# Patient Record
Sex: Female | Born: 2000 | Race: White | Hispanic: No | Marital: Single | Smoking: Never smoker
Health system: Southern US, Community
[De-identification: ages and names within clinical notes are randomized; demographics above are authoritative.]

## PROBLEM LIST (undated history)

## (undated) DIAGNOSIS — E119 Type 2 diabetes mellitus without complications: Secondary | ICD-10-CM

## (undated) DIAGNOSIS — F32A Depression, unspecified: Secondary | ICD-10-CM

## (undated) DIAGNOSIS — F419 Anxiety disorder, unspecified: Secondary | ICD-10-CM

## (undated) DIAGNOSIS — J45909 Unspecified asthma, uncomplicated: Secondary | ICD-10-CM

## (undated) HISTORY — DX: Unspecified asthma, uncomplicated: J45.909

## (undated) HISTORY — DX: Anxiety disorder, unspecified: F41.9

## (undated) HISTORY — PX: NO PAST SURGERIES: SHX2092

## (undated) HISTORY — DX: Depression, unspecified: F32.A

---
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
## 2021-01-14 ENCOUNTER — Encounter (INDEPENDENT_AMBULATORY_CARE_PROVIDER_SITE_OTHER): Payer: Self-pay | Admitting: Family

## 2021-01-14 ENCOUNTER — Other Ambulatory Visit: Payer: Self-pay

## 2021-01-14 ENCOUNTER — Ambulatory Visit (INDEPENDENT_AMBULATORY_CARE_PROVIDER_SITE_OTHER): Payer: BC Managed Care – PPO | Admitting: Family

## 2021-01-14 VITALS — BP 116/70 | HR 68 | Ht 67.52 in | Wt 172.8 lb

## 2021-01-14 DIAGNOSIS — R739 Hyperglycemia, unspecified: Secondary | ICD-10-CM

## 2021-01-14 DIAGNOSIS — E1065 Type 1 diabetes mellitus with hyperglycemia: Secondary | ICD-10-CM | POA: Diagnosis not present

## 2021-01-14 DIAGNOSIS — E10649 Type 1 diabetes mellitus with hypoglycemia without coma: Secondary | ICD-10-CM

## 2021-01-14 DIAGNOSIS — R7309 Other abnormal glucose: Secondary | ICD-10-CM | POA: Diagnosis not present

## 2021-01-14 DIAGNOSIS — Z794 Long term (current) use of insulin: Secondary | ICD-10-CM | POA: Diagnosis not present

## 2021-01-14 DIAGNOSIS — E109 Type 1 diabetes mellitus without complications: Secondary | ICD-10-CM

## 2021-01-14 LAB — POCT GLUCOSE (DEVICE FOR HOME USE): Glucose Fasting, POC: 200 mg/dL — AB (ref 70–99)

## 2021-01-14 LAB — POCT GLYCOSYLATED HEMOGLOBIN (HGB A1C): Hemoglobin A1C: 8.5 % — AB (ref 4.0–5.6)

## 2021-01-14 MED ORDER — INSULIN LISPRO (1 UNIT DIAL) 100 UNIT/ML (KWIKPEN): PEN_INJECTOR | SUBCUTANEOUS | 2 refills | Status: DC

## 2021-01-14 NOTE — Patient Instructions (Signed)

## 2021-01-14 NOTE — Progress Notes (Signed)
Pediatric Endocrinology Diabetes Consultation Follow-up Visit  Aelyn M Kaupp 07/05/2001 [id number]  Chief Complaint: Follow-up type 1 diabetes   Kaplan, Kristen W., PA-C   HPI: Sweet  is a 20 y.o. female presenting for follow-up of type 1 diabetes. she is accompanied to this visit by her mother.  1. Angell was diagnosed with type 1 diabetes on May 22, 2016. She had been complaining of polyuria/polydipsia/polyphagia with weight loss of about 16 pounds. She was seen by her PCP who determined that her blood sugar was 462 on POC and 513 on labs. They sent the family to the ER at [facility] where she was found to have type 1 diabetes. She was transitioned to [facility] for admission. She was admitted to the pediatric ward for introduction of insulin and diabetes education. She was started on MDI with Lantus and Novolog. She was transitioned to Humalog based on insurance coverage. She had a hemoglobin a1c of 12.6%. 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  2. Since last visit to [facility] on 08/2020, she has been well.  No ER visits or hospitalizations.  She is going to graduate from high school in June. She is working for a daycare a few days per week. She stays active with PE at school.   Wearing Dexcom CGM. She reports that she has started trying harder to carb count and dose her novolog correctly. Giving most injections before eating.   Concerns:  - Feels like she runs high between lunch and dinner.  - Frequently turns dexcom off because she doesn't like the alarms.    Insulin regimen: 38 units of Tresiba Humalog 100/20/4 plan  Hypoglycemia: Able to feel low blood sugars.  No glucagon needed recently.  Feels sweaty and shaky.  Blood glucose download:    Med-alert IaD: Not currently wearing. Injection sites: Arms, abdomen and legs  Annual labs due: 10/2020 ordered  Ophthalmology due: 2019. Discussed importance today.     3. ROS: Greater than 10 systems  reviewed with pertinent positives listed in HPI, otherwise neg. Constitutional: Sleeping well. Weight stable.  Eyes: No changes in vision. No blurry vision.  Ears/Nose/Mouth/Throat: No difficulty swallowing. No neck swelling  Cardiovascular: Denies palpitation. No chest pain  Respiratory: No increased work of breathing. No SOB Gastrointestinal: No constipation . Denies abdominal pain.  Genitourinary: No nocturia, no polyuria Neurologic: Normal sensation, no tremor Endocrine: No polydipsia.  No hyperpigmentation Psychiatric: Normal affect. Denies anxiety and depression.   Past Medical History:   No past medical history on file.  Medications:  Outpatient Encounter Medications as of 01/14/2021  Medication Sig  . Continuous Blood Gluc Sensor (DEXCOM G6 SENSOR) MISC Change sensor every 10 days  . Continuous Blood Gluc Transmit (DEXCOM G6 TRANSMITTER) MISC Inject 1 Device into the skin as directed. (re-use up to 8x with each new sensor)  . insulin degludec (TRESIBA FLEXTOUCH) 100 UNIT/ML FlexTouch Pen Inject up to 50 units per day SubQ  . [DISCONTINUED] insulin aspart (NOVOLOG) 100 UNIT/ML injection Inject into the skin 3 (three) times daily before meals.  . [DISCONTINUED] insulin lispro (HUMALOG) 100 UNIT/ML KwikPen INJECT UP TO 50 UNITS SUBCUTANEOUSLY DAILY  . albuterol (PROVENTIL HFA;VENTOLIN HFA) 108 (90 Base) MCG/ACT inhaler Inhale 2 puffs into the lungs every 6 (six) hours as needed for wheezing or shortness of breath. (Patient not taking: Reported on 04/28/2020)  . Blood Glucose Monitoring Suppl (ONETOUCH VERIO IQ SYSTEM) w/Device KIT Use to check blood glucose (Patient not taking: Reported on 09/14/2020)  .   Continuous Blood Gluc Receiver (DEXCOM G6 RECEIVER) DEVI 1 Device by Does not apply route as directed. (Patient not taking: Reported on 06/12/2020)  . Glucagon (BAQSIMI TWO PACK) 3 MG/DOSE POWD Place 3 mg into the nose as needed. (Patient not taking: Reported on 04/28/2020)  . insulin  lispro (HUMALOG) 100 UNIT/ML KwikPen Inject up to 50 units per day  . Insulin Pen Needle 31G X 4 MM MISC BD Pen Needles- brand specific Inject insulin via insulin pen 6 x daily  . ONETOUCH VERIO test strip CHECK BLOOD SUGAR 6X DAY (90 DAY SUPPLY) (Patient not taking: Reported on 09/14/2020)   No facility-administered encounter medications on file as of 01/14/2021.    Allergies: No Known Allergies  Surgical History: No past surgical history on file.  Family History:  No family history on file.    Social History: Lives with: Mother and Father  Currently in 11th grade  Physical Exam:  Vitals:   01/14/21 1119  BP: 116/70  Pulse: 68  Weight: 172 lb 12.8 oz (78.4 kg)  Height: 5' 7.52" (1.715 m)   BP 116/70 (BP Location: Right Arm, Patient Position: Sitting, Cuff Size: Normal)   Pulse 68   Ht 5' 7.52" (1.715 m)   Wt 172 lb 12.8 oz (78.4 kg)   BMI 26.65 kg/m  Body mass index: body mass index is 26.65 kg/m. Blood pressure percentiles are not available for patients who are 18 years or older.  Ht Readings from Last 3 Encounters:  01/14/21 5' 7.52" (1.715 m) (90 %, Z= 1.29)*  09/14/20 5' 7.6" (1.717 m) (91 %, Z= 1.32)*  06/12/20 5' 7.24" (1.708 m) (88 %, Z= 1.19)*   * Growth percentiles are based on CDC (Girls, 2-20 Years) data.   Wt Readings from Last 3 Encounters:  01/14/21 172 lb 12.8 oz (78.4 kg) (94 %, Z= 1.52)*  09/14/20 167 lb 6.4 oz (75.9 kg) (92 %, Z= 1.43)*  06/12/20 166 lb (75.3 kg) (92 %, Z= 1.42)*   * Growth percentiles are based on CDC (Girls, 2-20 Years) data.    PHYSICAL EXAM:  General: Well developed, well nourished female in no acute distress.   Head: Normocephalic, atraumatic.   Eyes:  Pupils equal and round. EOMI.   Sclera white.  No eye drainage.   Ears/Nose/Mouth/Throat: Nares patent, no nasal drainage.  Normal dentition, mucous membranes moist.   Neck: supple, no cervical lymphadenopathy, no thyromegaly Cardiovascular: regular rate, normal  S1/S2, no murmurs Respiratory: No increased work of breathing.  Lungs clear to auscultation bilaterally.  No wheezes. Abdomen: soft, nontender, nondistended. Normal bowel sounds.  No appreciable masses  Extremities: warm, well perfused, cap refill < 2 sec.   Musculoskeletal: Normal muscle mass.  Normal strength Skin: warm, dry.  No rash or lesions. Neurologic: alert and oriented, normal speech, no tremor   Labs: Last hemoglobin A1c: 11.9 % on 08/2020 Lab Results  Component Value Date   HGBA1C 8.5 (A) 01/14/2021   Results for orders placed or performed in visit on 01/14/21  POCT glycosylated hemoglobin (Hb A1C)  Result Value Ref Range   Hemoglobin A1C 8.5 (A) 4.0 - 5.6 %   HbA1c POC (<> result, manual entry)     HbA1c, POC (prediabetic range)     HbA1c, POC (controlled diabetic range)    POCT Glucose (Device for Home Use)  Result Value Ref Range   Glucose Fasting, POC 200 (A) 70 - 99 mg/dL   POC Glucose      Assessment/Plan: Jenaveve is   a 20 y.o. female with uncontrolled type 1 diabetes on MDI. She has made improvements since last visit and time in range has increased to >50% of the time. Needs to consistently wear Dexcom CGM. Hemoglobin A1c has improved to 8.5% but is higher then ADA gaol of <7.5%.    1-4. DM w/o complication type I, uncontrolled (HCC)/Hyperglycemia/Elevated A1c/hypoglycemia  - Tresiba 38 units  - Start Humalog 100/20/4 plan  - Reviewed meter and CGM download. Discussed trends and patterns.  - Rotate injection sites to prevent scar tissue.  - bolus 15 minutes prior to eating to limit blood sugar spikes.  - Reviewed carb counting and importance of accurate carb counting.  - Discussed signs and symptoms of hypoglycemia. Always have glucose available.  - POCT glucose and hemoglobin A1c  - Reviewed growth chart.  - Advised to put high alarms on mute and keep CGM with her at all times. Do not close Dexcom app.   Follow-up:   3 month.    >45 spent today  reviewing the medical chart, counseling the patient/family, and documenting today's visit.    When a patient is on insulin, intensive monitoring of blood glucose levels is necessary to avoid hyperglycemia and hypoglycemia. Severe hyperglycemia/hypoglycemia can lead to hospital admissions and be life threatening.     Hermenia Bers,  FNP-C  Pediatric Specialist  [address] [place]  [place], [address]  Tele: [phone]

## 2021-01-21 ENCOUNTER — Encounter (INDEPENDENT_AMBULATORY_CARE_PROVIDER_SITE_OTHER): Payer: Self-pay

## 2021-01-21 DIAGNOSIS — L700 Acne vulgaris: Secondary | ICD-10-CM | POA: Diagnosis not present

## 2021-01-21 DIAGNOSIS — D2339 Other benign neoplasm of skin of other parts of face: Secondary | ICD-10-CM | POA: Diagnosis not present

## 2021-01-28 ENCOUNTER — Encounter (INDEPENDENT_AMBULATORY_CARE_PROVIDER_SITE_OTHER): Payer: Self-pay

## 2021-01-28 ENCOUNTER — Other Ambulatory Visit (INDEPENDENT_AMBULATORY_CARE_PROVIDER_SITE_OTHER): Payer: Self-pay

## 2021-01-28 ENCOUNTER — Other Ambulatory Visit (INDEPENDENT_AMBULATORY_CARE_PROVIDER_SITE_OTHER): Payer: Self-pay | Admitting: Family

## 2021-01-28 MED ORDER — TRESIBA FLEXTOUCH 100 UNIT/ML ~~LOC~~ SOPN: PEN_INJECTOR | SUBCUTANEOUS | 5 refills | Status: DC

## 2021-02-01 ENCOUNTER — Encounter (INDEPENDENT_AMBULATORY_CARE_PROVIDER_SITE_OTHER): Payer: Self-pay

## 2021-02-17 ENCOUNTER — Other Ambulatory Visit (INDEPENDENT_AMBULATORY_CARE_PROVIDER_SITE_OTHER): Payer: Self-pay

## 2021-02-17 ENCOUNTER — Encounter (INDEPENDENT_AMBULATORY_CARE_PROVIDER_SITE_OTHER): Payer: Self-pay

## 2021-02-17 DIAGNOSIS — E109 Type 1 diabetes mellitus without complications: Secondary | ICD-10-CM

## 2021-02-17 MED ORDER — DEXCOM G6 SENSOR MISC: 1 refills | Status: DC

## 2021-03-10 ENCOUNTER — Other Ambulatory Visit (INDEPENDENT_AMBULATORY_CARE_PROVIDER_SITE_OTHER): Payer: Self-pay

## 2021-03-10 ENCOUNTER — Encounter (INDEPENDENT_AMBULATORY_CARE_PROVIDER_SITE_OTHER): Payer: Self-pay

## 2021-03-10 DIAGNOSIS — E109 Type 1 diabetes mellitus without complications: Secondary | ICD-10-CM

## 2021-03-10 MED ORDER — DEXCOM G6 TRANSMITTER MISC: 1.0000 | 3 refills | Status: DC

## 2021-03-10 MED ORDER — DEXCOM G6 SENSOR MISC: 3 refills | Status: DC

## 2021-04-01 ENCOUNTER — Encounter (INDEPENDENT_AMBULATORY_CARE_PROVIDER_SITE_OTHER): Payer: Self-pay

## 2021-04-06 ENCOUNTER — Encounter (INDEPENDENT_AMBULATORY_CARE_PROVIDER_SITE_OTHER): Payer: Self-pay

## 2021-04-07 ENCOUNTER — Other Ambulatory Visit (INDEPENDENT_AMBULATORY_CARE_PROVIDER_SITE_OTHER): Payer: Self-pay

## 2021-04-20 ENCOUNTER — Ambulatory Visit (INDEPENDENT_AMBULATORY_CARE_PROVIDER_SITE_OTHER): Payer: BC Managed Care – PPO | Admitting: Family

## 2021-05-10 DIAGNOSIS — B078 Other viral warts: Secondary | ICD-10-CM | POA: Diagnosis not present

## 2021-05-10 DIAGNOSIS — D2339 Other benign neoplasm of skin of other parts of face: Secondary | ICD-10-CM | POA: Diagnosis not present

## 2021-06-02 ENCOUNTER — Other Ambulatory Visit: Payer: Self-pay

## 2021-06-02 ENCOUNTER — Other Ambulatory Visit (INDEPENDENT_AMBULATORY_CARE_PROVIDER_SITE_OTHER): Payer: Self-pay

## 2021-06-02 ENCOUNTER — Encounter (INDEPENDENT_AMBULATORY_CARE_PROVIDER_SITE_OTHER): Payer: Self-pay | Admitting: Family

## 2021-06-02 ENCOUNTER — Ambulatory Visit (INDEPENDENT_AMBULATORY_CARE_PROVIDER_SITE_OTHER): Payer: BC Managed Care – PPO | Admitting: Family

## 2021-06-02 VITALS — BP 102/64 | HR 76 | Wt 174.8 lb

## 2021-06-02 DIAGNOSIS — Z794 Long term (current) use of insulin: Secondary | ICD-10-CM | POA: Diagnosis not present

## 2021-06-02 DIAGNOSIS — E109 Type 1 diabetes mellitus without complications: Secondary | ICD-10-CM

## 2021-06-02 LAB — POCT GLYCOSYLATED HEMOGLOBIN (HGB A1C): Hemoglobin A1C: 7.7 % — AB (ref 4.0–5.6)

## 2021-06-02 LAB — POCT GLUCOSE (DEVICE FOR HOME USE): Glucose Fasting, POC: 141 mg/dL — AB (ref 70–99)

## 2021-06-02 MED ORDER — DEXCOM G6 TRANSMITTER MISC: 1.0000 | 3 refills | Status: DC

## 2021-06-02 MED ORDER — TRESIBA FLEXTOUCH 100 UNIT/ML ~~LOC~~ SOPN: PEN_INJECTOR | SUBCUTANEOUS | 11 refills | Status: DC

## 2021-06-02 MED ORDER — BAQSIMI TWO PACK 3 MG/DOSE NA POWD: 3.0000 mg | NASAL | 3 refills | Status: AC | PRN

## 2021-06-02 MED ORDER — DEXCOM G6 SENSOR MISC: 3 refills | Status: DC

## 2021-06-02 NOTE — Patient Instructions (Addendum)
-   Give 1 unit for every 3 grams of carbs  - 1 unit for for every 20 points above 100 It was a pleasure seeing you in clinic today. Please do not hesitate to contact me if you have questions or concerns.   At [facility], we are committed to providing exceptional care. You will receive a patient satisfaction survey through text or email regarding your visit today. Your opinion is important to me. Comments are appreciated.

## 2021-06-02 NOTE — Progress Notes (Signed)
Pediatric Endocrinology Diabetes Consultation Follow-up Visit  [name] May 24, 2001 [id number]  Chief Complaint: Follow-up type 1 diabetes   Aletha Halim., PA-C   HPI: Leslie Mccarthy  is a 20 y.o. female presenting for follow-up of type 1 diabetes. she is accompanied to this visit by her mother.  1. Leslie Mccarthy was diagnosed with type 1 diabetes on May 22, 2016. She had been complaining of polyuria/polydipsia/polyphagia with weight loss of about 16 pounds. She was seen by her PCP who determined that her blood sugar was 462 on POC and 513 on labs. They sent the family to the ER at [facility] where she was found to have type 1 diabetes. She was transitioned to [facility] for admission. She was admitted to the pediatric ward for introduction of insulin and diabetes education. She was started on MDI with Lantus and Novolog. She was transitioned to Humalog based on insurance coverage. She had a hemoglobin a1c of 12.6%. 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  2. Since last visit to [facility] on 12/2020 she has been well.  No ER visits or hospitalizations.  She graduated from high school and was top of her class. She plans to do a [employer] year and then possible go back to school. 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   She wear Dexcom CGm majority of the time but recently had issues with bleeding at sensor site. When she is not wearing Dexcom she checks blood sugars 3-4 x per day. Gives Novolog shots before eating most of the time. Estimates she takes around 15 units per meal. Hypoglycemia is rare but usually occurs at night if she does go low.   Concerns:  - She has been stressed lately and blood sugars have been running hig.  - Since switching back to Lantus due to insurance blood sugars have been higher. Also reports that Lantus burns severely with injection and leave redness.   Insulin regimen: 38 units of Lantus,  Humalog 100/20/4 plan  Hypoglycemia: Able to  feel low blood sugars.  No glucagon needed recently.  Feels sweaty and shaky.  Blood glucose download:    Med-alert IaD: Not currently wearing. Injection sites: Arms, abdomen and legs  Annual labs due: 10/2020 ordered  Ophthalmology due: 2019. Discussed importance today.     3. ROS: Greater than 10 systems reviewed with pertinent positives listed in HPI, otherwise neg. Constitutional: Sleeping well. Weight stable.  Eyes: No changes in vision. No blurry vision.  Ears/Nose/Mouth/Throat: No difficulty swallowing. No neck swelling  Cardiovascular: Denies palpitation. No chest pain  Respiratory: No increased work of breathing. No SOB Gastrointestinal: No constipation . Denies abdominal pain.  Genitourinary: No nocturia, no polyuria Neurologic: Normal sensation, no tremor Endocrine: No polydipsia.  No hyperpigmentation Psychiatric: Normal affect. Denies anxiety and depression.   Past Medical History:   No past medical history on file.  Medications:  Outpatient Encounter Medications as of 06/02/2021  Medication Sig   albuterol (PROVENTIL HFA;VENTOLIN HFA) 108 (90 Base) MCG/ACT inhaler Inhale 2 puffs into the lungs every 6 (six) hours as needed for wheezing or shortness of breath.   Blood Glucose Monitoring Suppl (ONETOUCH VERIO IQ SYSTEM) w/Device KIT Use to check blood glucose   Continuous Blood Gluc Receiver (DEXCOM G6 RECEIVER) DEVI 1 Device by Does not apply route as directed.   insulin degludec (TRESIBA FLEXTOUCH) 100 UNIT/ML FlexTouch Pen Inject up to 50 units per day SubQ   insulin lispro (HUMALOG) 100 UNIT/ML KwikPen Inject  up to 50 units per day   Insulin Pen Needle 31G X 4 MM MISC BD Pen Needles- brand specific Inject insulin via insulin pen 6 x daily   ONETOUCH VERIO test strip CHECK BLOOD SUGAR 6X DAY (90 DAY SUPPLY)   [DISCONTINUED] Continuous Blood Gluc Sensor (DEXCOM G6 SENSOR) MISC Change sensor every 10 days   [DISCONTINUED] Continuous Blood Gluc Transmit (DEXCOM G6  TRANSMITTER) MISC Inject 1 Device into the skin as directed. Change sensor every 90 days   [DISCONTINUED] Glucagon (BAQSIMI TWO PACK) 3 MG/DOSE POWD Place 3 mg into the nose as needed.   [DISCONTINUED] insulin glargine (LANTUS SOLOSTAR) 100 UNIT/ML Solostar Pen Inject up to 50 units daily   No facility-administered encounter medications on file as of 06/02/2021.    Allergies: No Known Allergies  Surgical History: No past surgical history on file.  Family History:  No family history on file.    Social History: Lives with: Mother and Father  Graduated. Working as Consulting civil engineer.   Physical Exam:  Vitals:   06/02/21 1002  BP: 102/64  Pulse: 76  Weight: 174 lb 12.8 oz (79.3 kg)    BP 102/64 (BP Location: Right Arm, Patient Position: Sitting, Cuff Size: Normal)   Pulse 76   Wt 174 lb 12.8 oz (79.3 kg)   BMI 26.96 kg/m  Body mass index: body mass index is 26.96 kg/m. Blood pressure percentiles are not available for patients who are 18 years or older.  Ht Readings from Last 3 Encounters:  01/14/21 5' 7.52" (1.715 m) (90 %, Z= 1.29)*  09/14/20 5' 7.6" (1.717 m) (91 %, Z= 1.32)*  06/12/20 5' 7.24" (1.708 m) (88 %, Z= 1.19)*   * Growth percentiles are based on CDC (Girls, 2-20 Years) data.   Wt Readings from Last 3 Encounters:  06/02/21 174 lb 12.8 oz (79.3 kg) (94 %, Z= 1.54)*  01/14/21 172 lb 12.8 oz (78.4 kg) (94 %, Z= 1.52)*  09/14/20 167 lb 6.4 oz (75.9 kg) (92 %, Z= 1.43)*   * Growth percentiles are based on CDC (Girls, 2-20 Years) data.    PHYSICAL EXAM:   General: Well developed, well nourished female in no acute distress.   Head: Normocephalic, atraumatic.   Eyes:  Pupils equal and round. EOMI.   Sclera white.  No eye drainage.   Ears/Nose/Mouth/Throat: Nares patent, no nasal drainage.  Normal dentition, mucous membranes moist.   Neck: supple, no cervical lymphadenopathy, no thyromegaly Cardiovascular: regular rate, normal S1/S2, no murmurs Respiratory:  No increased work of breathing.  Lungs clear to auscultation bilaterally.  No wheezes. Abdomen: soft, nontender, nondistended. Normal bowel sounds.  No appreciable masses  Extremities: warm, well perfused, cap refill < 2 sec.   Musculoskeletal: Normal muscle mass.  Normal strength Skin: warm, dry.  No rash or lesions. Neurologic: alert and oriented, normal speech, no tremor  Labs: Last hemoglobin A1c: 8.5 % on 12/2020 Lab Results  Component Value Date   HGBA1C 7.7 (A) 06/02/2021   Results for orders placed or performed in visit on 06/02/21  POCT glycosylated hemoglobin (Hb A1C)  Result Value Ref Range   Hemoglobin A1C 7.7 (A) 4.0 - 5.6 %   HbA1c POC (<> result, manual entry)     HbA1c, POC (prediabetic range)     HbA1c, POC (controlled diabetic range)    POCT Glucose (Device for Home Use)  Result Value Ref Range   Glucose Fasting, POC 141 (A) 70 - 99 mg/dL   POC Glucose  Assessment/Plan: Catelyn is a 20 y.o. female with uncontrolled type 1 diabetes on MDI. She is having more variability with blood sugars since changing from [place] to Lantus 1 month ago. Also has significant burning. Pattern of hyperglycemia between 10am-3pm. Hemoglobin A1c is 7.7% today.     1-4. DM w/o complication type I, uncontrolled (HCC)/Hyperglycemia/Elevated A1c/hypoglycemia  - restart tresiba. Our office will do PA if necessary since it provides with better glucose stability/control and less burning. She will take 38 units daily  - Start Humalog 100/20/3 plan  - Reviewed  CGM download. Discussed trends and patterns.  - Rotate injection sites to prevent scar tissue.  - bolus 15 minutes prior to eating to limit blood sugar spikes.  - Reviewed carb counting and importance of accurate carb counting.  - Discussed signs and symptoms of hypoglycemia. Always have glucose available.  - POCT glucose and hemoglobin A1c  - Reviewed growth chart.  - Discussed closed loop insulin pumps including Omnipod 5 and  tandem tslim  - Lipid panel, TFTs and microalbumin ordered.   Follow-up:   3 month.   >45 spent today reviewing the medical chart, counseling the patient/family, and documenting today's visit.    When a patient is on insulin, intensive monitoring of blood glucose levels is necessary to avoid hyperglycemia and hypoglycemia. Severe hyperglycemia/hypoglycemia can lead to hospital admissions and be life threatening.     Hermenia Bers,  FNP-C  Pediatric Specialist  [address] [place]  [place], [address]  Tele: [phone]

## 2021-06-03 ENCOUNTER — Encounter (INDEPENDENT_AMBULATORY_CARE_PROVIDER_SITE_OTHER): Payer: Self-pay

## 2021-06-03 LAB — LIPID PANEL
Cholesterol: 137 mg/dL (ref <170)
HDL: 50 mg/dL (ref >45)
LDL Cholesterol (Calc): 71 mg/dL (calc) (ref <110)
Non-HDL Cholesterol (Calc): 87 mg/dL (calc) (ref <120)
Total CHOL/HDL Ratio: 2.7 (calc) (ref <5.0)
Triglycerides: 77 mg/dL (ref <90)

## 2021-06-03 LAB — MICROALBUMIN / CREATININE URINE RATIO
Creatinine, Urine: 244 mg/dL (ref 20–275)
Microalb Creat Ratio: 2 mcg/mg creat (ref <30)
Microalb, Ur: 0.4 mg/dL

## 2021-06-03 LAB — TSH: TSH: 1.12 mIU/L

## 2021-06-03 LAB — T4, FREE: Free T4: 1.1 ng/dL (ref 0.8–1.4)

## 2021-06-26 ENCOUNTER — Encounter (INDEPENDENT_AMBULATORY_CARE_PROVIDER_SITE_OTHER): Payer: Self-pay

## 2021-06-29 ENCOUNTER — Other Ambulatory Visit (INDEPENDENT_AMBULATORY_CARE_PROVIDER_SITE_OTHER): Payer: Self-pay

## 2021-06-29 DIAGNOSIS — E109 Type 1 diabetes mellitus without complications: Secondary | ICD-10-CM

## 2021-06-29 MED ORDER — DEXCOM G6 TRANSMITTER MISC: 1.0000 | 3 refills | Status: DC

## 2021-07-28 ENCOUNTER — Encounter (INDEPENDENT_AMBULATORY_CARE_PROVIDER_SITE_OTHER): Payer: Self-pay

## 2021-07-30 ENCOUNTER — Telehealth (INDEPENDENT_AMBULATORY_CARE_PROVIDER_SITE_OTHER): Payer: Self-pay | Admitting: Pharmacist

## 2021-07-30 DIAGNOSIS — E109 Type 1 diabetes mellitus without complications: Secondary | ICD-10-CM

## 2021-07-30 NOTE — Telephone Encounter (Signed)
Please contact most recently utilized pharmacy to determine pharmacy benefits  [phone] [place], [place] - [address]  [address] [place], [place] [address]  Phone:  [phone]  Fax:  [phone]  DEA #:  [id number]  DAW Reason: --   Please ask pharmacy staff member for following information: 1) RxBIN 2) RxGroup 3) RxPCN 4) ID number  Please also ask for pharmacy help desk phone number.  Thank you for involving clinical pharmacist/diabetes educator to assist in providing this patient's care.   Zachery Conch, PharmD, BCACP, CDCES, CPP

## 2021-07-30 NOTE — Telephone Encounter (Signed)
1) RxBIN [id number] 2) RxGroup [id number] 3) RxPCN  WG 4) ID number [id number]  [employer]

## 2021-08-04 NOTE — Telephone Encounter (Signed)
Please run benefits investigation for Omnipod 5 device. This is not a specialty medication and can be filled at the local pharmacy.   Omnipod 5 G6 Intro Kit (1 kit, 30 day supply), NDC [address]-69   Omnipod 5 G6 Pods (3 boxes (each box has 5 pods), 30 day supply), NDC [phone]   Can you also please let me know 1) if PA is required? 2) RxBIN, RxPCN, RxGroup, ID number of pharmacy benefits    Thank you for involving clinical pharmacist/diabetes educator to assist in providing this patient's care.    Drexel Iha, PharmD, BCACP, [place], CPP

## 2021-08-15 ENCOUNTER — Other Ambulatory Visit (INDEPENDENT_AMBULATORY_CARE_PROVIDER_SITE_OTHER): Payer: Self-pay | Admitting: Family

## 2021-08-15 DIAGNOSIS — E109 Type 1 diabetes mellitus without complications: Secondary | ICD-10-CM

## 2021-08-16 MED ORDER — OMNIPOD 5 DEXG7G6 INTRO GEN 5 KIT: 1.0000 | PACK | 1 refills | Status: DC

## 2021-08-16 NOTE — Addendum Note (Signed)
Addended by: Buena Irish on: 08/16/2021 04:33 PM   Modules accepted: Orders

## 2021-08-16 NOTE — Telephone Encounter (Signed)
Called patient's most recent pharmacy to obtain insurance benefits information and received the following information:  Rx Bin: [id number] Rx PCN: [id number] Rx Group: [id number] ID Number: [id number]  Relationship: [facility] Pharm[facility] Help Desk: [phone]  Called patients's pharmacy benefits to run a pharmacy benefits check on the following medications/devices:  Omnipod 5 G6 Intro Kit (1 kit, 30 day supply), NDC [address]-01   Omnipod 5 G6 Pods (3 boxes (each box has 5 pods), 30 day supply), NDC [phone]  Omnipod 5 G6 Intro Kit and Omnipod 5 G6 Pods will require prior authorization. Prior Authorization was submitted and completed through Cover My Meds.   Estimated copay for the following medications is: Omnipod 5 G6 Intro Kit (1 kit, 30 day supply), NDC [address]-01: $0.00   Omnipod 5 G6 Pods (3 boxes (each box has 5 pods), 30 day supply), NDC [address]-21: $0.00     Thank you for involving pharmacy in this patient's care.  Pamela Vega Rios, PharmD PGY1 Ambulatory Care Pharmacy Resident 08/16/2021 3:48 PM  

## 2021-08-16 NOTE — Telephone Encounter (Signed)
Appreciate Dr. Wendee Beavers Rios's assistance as this was a tedious request.   Omnipod 5 Intro Kit will cost $0. The pod refills will also request $0. I sent prescription to following local pharmacy  [id number] [place], [place] - [address]  [address] [place], [place] [address]  Phone:  [phone]  Fax:  [phone]  DEA #:  [id number]  [place] Reason: --   Please inform patient. Patient does not have to get prescription from pharmacy before pump appointment.  Please schedule the following appointments:   Prepump appointment (60 min, in person or virtual training); must be scheduled before pump start appointment Pump start appointment (120 min, MUST BE IN PERSON,), schedule after pump appointment   Thank you for involving clinical pharmacist/diabetes educator to assist in providing this patient's care.    Drexel Iha, PharmD, BCACP, [place], CPP

## 2021-08-19 ENCOUNTER — Telehealth (INDEPENDENT_AMBULATORY_CARE_PROVIDER_SITE_OTHER): Payer: Self-pay

## 2021-08-19 NOTE — Telephone Encounter (Addendum)
Received approval for Omni Pod 5 Gen 6. Approval coverage 08/16/2021-08/16/2022.   Received approval for OmniPod 5 intro Kit Gen 5 Approval date 08/16/2021-09/15/2021

## 2021-09-09 ENCOUNTER — Other Ambulatory Visit: Payer: Self-pay

## 2021-09-09 ENCOUNTER — Ambulatory Visit (INDEPENDENT_AMBULATORY_CARE_PROVIDER_SITE_OTHER): Payer: BC Managed Care – PPO | Admitting: Pharmacist

## 2021-09-09 VITALS — Wt 161.8 lb

## 2021-09-09 DIAGNOSIS — E109 Type 1 diabetes mellitus without complications: Secondary | ICD-10-CM

## 2021-09-09 LAB — POCT GLUCOSE (DEVICE FOR HOME USE): Glucose Fasting, POC: 173 mg/dL — AB (ref 70–99)

## 2021-09-09 LAB — POCT GLYCOSYLATED HEMOGLOBIN (HGB A1C): Hemoglobin A1C: 8.7 % — AB (ref 4.0–5.6)

## 2021-09-09 MED ORDER — INSULIN LISPRO 100 UNIT/ML IJ SOLN: INTRAMUSCULAR | 5 refills | Status: DC

## 2021-09-09 NOTE — Progress Notes (Addendum)
S:     Chief Complaint  Patient presents with   Diabetes    Prepump Education    Endocrinology provider: Gretchen Short, NP (no upcoming appt)  Patient has decided to initiate process to start Omnipod 5 insulin pump. PMH significant for T1DM.   Patient presents today with her mother, Leslie Mccarthy. She is taking 10-20 units of Humalog three times daily. She forgot her Humalog once in the past few weeks. She has not forgotten to take [place].   Insurance Coverage: BCBS Rx [place]: [id number] Rx PCN: [facility] Rx Group: [id number] ID Number: [id number]  Relationship: [facility] Help Desk: [phone]  [phone] [place] [place] - [address]  [address] [place], [place] [address]  Phone:  [phone]  Fax:  [phone]  DEA #:  [id number]  DAW Reason: --   Medication Adherence -Patient reports mostly adherence with medications.  -Current diabetes medications include: Tresiba 38 units daily, Humalog (ICR 1:3, ISF 1:20, target BG 100) -Prior diabetes medications include: Lantus (switched to [place] due to stinging (pH of Lantus is ~4 while all other insulin is~7))   Pre-pump Topics Insulin Pump Basics Sick Day Management Pump Failure Travel  Pump Start Instructions   Labs:    There were no vitals filed for this visit.  HbA1c Lab Results  Component Value Date   HGBA1C 7.7 (A) 06/02/2021   HGBA1C 8.5 (A) 01/14/2021   HGBA1C 11.9 (A) 09/14/2020    Pancreatic Islet Cell Autoantibodies Lab Results  Component Value Date   ISLETAB 1:256 (H) 05/22/2016    Insulin Autoantibodies No results found for: INSULINAB  Glutamic Acid Decarboxylase Autoantibodies Lab Results  Component Value Date   GLUTAMICACAB 14.1 (H) 05/22/2016    ZnT8 Autoantibodies No results found for: ZNT8AB  IA-2 Autoantibodies No results found for: LABIA2  C-Peptide Lab Results  Component Value Date   CPEPTIDE 0.8 (L)  05/22/2016    Microalbumin Lab Results  Component Value Date   MICRALBCREAT 2 06/02/2021    Lipids    Component Value Date/Time   CHOL 137 06/02/2021 1038   TRIG 77 06/02/2021 1038   HDL 50 06/02/2021 1038   CHOLHDL 2.7 06/02/2021 1038   LDLCALC 71 06/02/2021 1038    Assessment: Education - Thoroughly discussed all pre-pump topics (insulin pump basics, sick day management, pump failure, travel, and pump start instructions).   Pump Start Instructions - Sent prescription for Humalog vial to patient's preferred pharmacy. The patient/family understand that the family should bring all insulin pump supplies as well as insulin vial to pump start appointment. Advised patient to follow Guinea-B[place]au taper: 2 days prior to pump start take [place] 19 units daily, 1 day prior to pump start: skip Guinea-Bissau, day of pump start: skip Guinea-Bissau.   Plan: Pre-Pump Education Discussed all pre-pump topics (insulin pump basics, sick day management, pump failure, travel, and pump start instructions) until family felt confident in their understanding of each topic.  Pump Start Appointment Sent prescription for Humalog vial to patient's preferred pharmacy.  The patient/family understand that the family should bring all insulin pump supplies as well as insulin vial to pump start appointment.  Advised patient to follow Guinea-Bissau taper: 2 days prior to pump start take [place] 19 units daily, 1 day prior to pump start: skip Guinea-Bissau, day of pump start: skip [place]-B[place]au.  Follow Up: 10/14/21 8:30 am  Written patient instructions provided; emailed copy to ([email or web address], [email or web address])  This appointment required 60 minutes of patient care (this includes precharting, chart review, review of results, face-to-face care, etc.).  Thank you for involving clinical pharmacist/diabetes educator to assist in providing this patient's care.  Zachery Conch, PharmD, BCACP, CDCES, CPP  I have reviewed the following  documentation and am in agreeance with the plan. I was immediately available to the clinical pharmacist for questions and collaboration.  Gretchen Short,  FNP-C  Pediatric Specialist  [address]  [place], [address]  Tele: [phone]

## 2021-09-15 ENCOUNTER — Encounter (INDEPENDENT_AMBULATORY_CARE_PROVIDER_SITE_OTHER): Payer: Self-pay | Admitting: Family

## 2021-09-15 ENCOUNTER — Ambulatory Visit (INDEPENDENT_AMBULATORY_CARE_PROVIDER_SITE_OTHER): Payer: BC Managed Care – PPO | Admitting: Family

## 2021-09-15 ENCOUNTER — Other Ambulatory Visit: Payer: Self-pay

## 2021-09-15 ENCOUNTER — Telehealth (INDEPENDENT_AMBULATORY_CARE_PROVIDER_SITE_OTHER): Payer: Self-pay | Admitting: Family

## 2021-09-15 VITALS — BP 116/70 | HR 62 | Wt 163.0 lb

## 2021-09-15 DIAGNOSIS — E109 Type 1 diabetes mellitus without complications: Secondary | ICD-10-CM

## 2021-09-15 DIAGNOSIS — Z794 Long term (current) use of insulin: Secondary | ICD-10-CM | POA: Diagnosis not present

## 2021-09-15 LAB — POCT GLUCOSE (DEVICE FOR HOME USE): POC Glucose: 66 mg/dl — AB (ref 70–99)

## 2021-09-15 LAB — POCT GLYCOSYLATED HEMOGLOBIN (HGB A1C): Hemoglobin A1C: 8.9 % — AB (ref 4.0–5.6)

## 2021-09-15 NOTE — Progress Notes (Signed)
Pediatric Endocrinology Diabetes Consultation Follow-up Visit  CONLEY PAWLING 04-13-2001 [id number]  Chief Complaint: Follow-up type 1 diabetes   Leslie Mccarthy., PA-C   HPI: Leslie Mccarthy  is a 20 y.o. female presenting for follow-up of type 1 diabetes. she is accompanied to this visit by her mother.  1. Leslie Mccarthy was diagnosed with type 1 diabetes on May 22, 2016. She had been complaining of polyuria/polydipsia/polyphagia with weight loss of about 16 pounds. She was seen by her PCP who determined that her blood sugar was 462 on POC and 513 on labs. They sent the family to the ER at [facility] where she was found to have type 1 diabetes. She was transitioned to [facility] for admission. She was admitted to the pediatric ward for introduction of insulin and diabetes education. She was started on MDI with Lantus and Novolog. She was transitioned to Humalog based on insurance coverage. She had a hemoglobin a1c of 12.6%. 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  2. Since last visit to [facility] on 04/2021 she has been well.  No ER visits or hospitalizations.  She is an assistance teach for kindergarten full time, she is enjoying. Considering going back to school but she is unsure at this point. 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   She recently met with Dr. Ladona Ridgel to discuss insulin pump therapy, she is going to start the Omnipod 5 soon. She reports she is doing well giving injections before eating. Using Dexcom CGM which is working well for her. Hypoglycemia has been rare in general.   Concerns:  - Typically runs high after lunch.    Insulin regimen: 32 units of Lantus,  Humalog 100/20/3 plan  Hypoglycemia: Able to feel low blood sugars.  No glucagon needed recently.  Feels sweaty and shaky.  Blood glucose download:    Med-alert IaD: Not currently wearing. Injection sites: Arms, abdomen and legs  Annual labs due: 04/2022 ordered  Ophthalmology due: 2019.  Discussed importance today.     3. ROS: Greater than 10 systems reviewed with pertinent positives listed in HPI, otherwise neg. Constitutional: Sleeping well. Weight stable.  Eyes: No changes in vision. No blurry vision.  Ears/Nose/Mouth/Throat: No difficulty swallowing. No neck swelling  Cardiovascular: Denies palpitation. No chest pain  Respiratory: No increased work of breathing. No SOB Gastrointestinal: No constipation . Denies abdominal pain.  Genitourinary: No nocturia, no polyuria Neurologic: Normal sensation, no tremor Endocrine: No polydipsia.  No hyperpigmentation Psychiatric: Normal affect. Denies anxiety and depression.   Past Medical History:   No past medical history on file.  Medications:  Outpatient Encounter Medications as of 09/15/2021  Medication Sig   albuterol (PROVENTIL HFA;VENTOLIN HFA) 108 (90 Base) MCG/ACT inhaler Inhale 2 puffs into the lungs every 6 (six) hours as needed for wheezing or shortness of breath. (Patient not taking: Reported on 09/09/2021)   Blood Glucose Monitoring Suppl (ONETOUCH VERIO IQ SYSTEM) w/Device KIT Use to check blood glucose (Patient not taking: Reported on 09/09/2021)   Continuous Blood Gluc Receiver (DEXCOM G6 RECEIVER) DEVI 1 Device by Does not apply route as directed. (Patient not taking: Reported on 09/09/2021)   Continuous Blood Gluc Sensor (DEXCOM G6 SENSOR) MISC Change sensor every 10 days   Continuous Blood Gluc Transmit (DEXCOM G6 TRANSMITTER) MISC Inject 1 Device into the skin as directed. Change sensor every 90 days   doxycycline (VIBRA-TABS) 100 MG tablet Take 100 mg by mouth 2 (two) times daily as needed.  Glucagon (BAQSIMI TWO PACK) 3 MG/DOSE POWD Place 3 mg into the nose as needed. (Patient not taking: Reported on 09/09/2021)   insulin degludec (TRESIBA FLEXTOUCH) 100 UNIT/ML FlexTouch Pen Inject up to 50 units per day SubQ   Insulin Disposable Pump (OMNIPOD 5 G6 INTRO, GEN 5,) KIT Inject 1 Device into the skin as  directed. Change pod every 2 days. This will be a 30 day supply. Please fill for [facility] [id number] (Patient not taking: Reported on 09/09/2021)   insulin lispro (HUMALOG) 100 UNIT/ML injection Inject up to 200 units into insulin pump every 2-3 days per provider guidance. Please fill for insulin VIAL.   insulin lispro (HUMALOG) 100 UNIT/ML KwikPen Inject up to 50 units per day   Insulin Pen Needle (B-D UF III MINI PEN NEEDLES) 31G X 5 MM MISC USE AS DIRECTED TO INJECT INSULIN 6 TIMES DAILY   ONETOUCH VERIO test strip CHECK BLOOD SUGAR 6X DAY (90 DAY SUPPLY) (Patient not taking: Reported on 09/09/2021)   No facility-administered encounter medications on file as of 09/15/2021.    Allergies: No Known Allergies  Surgical History: No past surgical history on file.  Family History:  No family history on file.    Social History: Lives with: Mother and Father  Graduated. Working as Consulting civil engineer.   Physical Exam:  There were no vitals filed for this visit.   There were no vitals taken for this visit. Body mass index: body mass index is unknown because there is no height or weight on file. Blood pressure percentiles are not available for patients who are 18 years or older.  Ht Readings from Last 3 Encounters:  01/14/21 5' 7.52" (1.715 m) (90 %, Z= 1.29)*  09/14/20 5' 7.6" (1.717 m) (91 %, Z= 1.32)*  06/12/20 5' 7.24" (1.708 m) (88 %, Z= 1.19)*   * Growth percentiles are based on CDC (Girls, 2-20 Years) data.   Wt Readings from Last 3 Encounters:  09/09/21 161 lb 12.8 oz (73.4 kg) (89 %, Z= 1.23)*  06/02/21 174 lb 12.8 oz (79.3 kg) (94 %, Z= 1.54)*  01/14/21 172 lb 12.8 oz (78.4 kg) (94 %, Z= 1.52)*   * Growth percentiles are based on CDC (Girls, 2-20 Years) data.    PHYSICAL EXAM:  General: Well developed, well nourished female in no acute distress.   Head: Normocephalic, atraumatic.   Eyes:  Pupils equal and round. EOMI.   Sclera white.  No eye drainage.    Ears/Nose/Mouth/Throat: Nares patent, no nasal drainage.  Normal dentition, mucous membranes moist.   Neck: supple, no cervical lymphadenopathy, no thyromegaly Cardiovascular: regular rate, normal S1/S2, no murmurs Respiratory: No increased work of breathing.  Lungs clear to auscultation bilaterally.  No wheezes. Abdomen: soft, nontender, nondistended. Normal bowel sounds.  No appreciable masses  Extremities: warm, well perfused, cap refill < 2 sec.   Musculoskeletal: Normal muscle mass.  Normal strength Skin: warm, dry.  No rash or lesions. Neurologic: alert and oriented, normal speech, no tremor   Labs: Last hemoglobin A1c: 8.7 % on 08/2021 Lab Results  Component Value Date   HGBA1C 8.7 (A) 09/09/2021   Results for orders placed or performed in visit on 09/09/21  POCT Glucose (Device for Home Use)  Result Value Ref Range   Glucose Fasting, POC 173 (A) 70 - 99 mg/dL   POC Glucose    POCT glycosylated hemoglobin (Hb A1C)  Result Value Ref Range   Hemoglobin A1C 8.7 (A) 4.0 - 5.6 %   HbA1c  POC (<> result, manual entry)     HbA1c, POC (prediabetic range)     HbA1c, POC (controlled diabetic range)      Assessment/Plan: Janith is a 20 y.o. female with uncontrolled type 1 diabetes on MDI. She is having more hyperglycemia throughout the day, needs more long acting insulin. She will benefit from closed loop Omnipod 5 insulin pump. Hemoglobin A1c is 8.7 on 08/2021.    1-4. DM w/o complication type I, uncontrolled (HCC)/Hyperglycemia/Elevated A1c/hypoglycemia  - Increase Tresiba to 35 units  -  Humalog 100/20/3 plan  - Reviewed meter and CGM download. Discussed trends and patterns.  - Rotate injection sites to prevent scar tissue.  - bolus 15 minutes prior to eating to limit blood sugar spikes.  - Reviewed carb counting and importance of accurate carb counting.  - Discussed signs and symptoms of hypoglycemia. Always have glucose available.  - POCT glucose and hemoglobin A1c  -  Reviewed growth chart.    Follow-up:   3 month.   >45 spent today reviewing the medical chart, counseling the patient/family, and documenting today's visit.    When a patient is on insulin, intensive monitoring of blood glucose levels is necessary to avoid hyperglycemia and hypoglycemia. Severe hyperglycemia/hypoglycemia can lead to hospital admissions and be life threatening.     Hermenia Bers,  FNP-C  Pediatric Specialist  [address] [place]  [place], [address]  Tele: [phone]

## 2021-09-15 NOTE — Patient Instructions (Signed)
It was a pleasure seeing you in clinic today. Please do not hesitate to contact me if you have questions or concerns.  ° °Please sign up for MyChart. This is a communication tool that allows you to send an email directly to me. This can be used for questions, prescriptions and blood sugar reports. We will also release labs to you with instructions on MyChart. Please do not use MyChart if you need immediate or emergency assistance. Ask our wonderful front office staff if you need assistance.  ° °

## 2021-09-15 NOTE — Telephone Encounter (Signed)
  Who's calling (name and relationship to patient) :[facility]   Best contact number: [phone] Provider they see: Ovidio Kin Reason for call:  Please contact pharmacy to discuss patient, a fax was also sent to office in regards to the matter. Office hours M-F 8am -8pm   PRESCRIPTION REFILL ONLY  Name of prescription:  Pharmacy:

## 2021-09-18 ENCOUNTER — Encounter (INDEPENDENT_AMBULATORY_CARE_PROVIDER_SITE_OTHER): Payer: Self-pay

## 2021-09-20 ENCOUNTER — Telehealth (INDEPENDENT_AMBULATORY_CARE_PROVIDER_SITE_OTHER): Payer: Self-pay | Admitting: Family

## 2021-09-20 NOTE — Telephone Encounter (Signed)
  Who's calling (name and relationship to patient) : Tour manager number:  Provider they see Ovidio Kin   Reason for call:fax was sent last week for script sent for omnipod 5 wanted to know if fax was received     PRESCRIPTION REFILL ONLY  Name of prescription:  Pharmacy:

## 2021-09-28 ENCOUNTER — Telehealth (INDEPENDENT_AMBULATORY_CARE_PROVIDER_SITE_OTHER): Payer: Self-pay | Admitting: Family

## 2021-09-28 NOTE — Telephone Encounter (Signed)
  Who's calling (name and relationship to patient) : Pharmacy   Best contact number:[phone]  Provider they ZOX:[id number]   Reason for call: Status of prescription request for Omnipod 5 sent on 11.28.2022    PRESCRIPTION REFILL ONLY  Name of prescription: Omnipod 5  Pharmacy:

## 2021-09-29 ENCOUNTER — Encounter (INDEPENDENT_AMBULATORY_CARE_PROVIDER_SITE_OTHER): Payer: Self-pay

## 2021-10-09 NOTE — Progress Notes (Addendum)
Subjective:  Chief Complaint  Patient presents with   Diabetes    Education    Endocrinology provider: Gretchen Short, NP (upcoming appt 12/13/21 11:00 am)  Patient referred to me by Gretchen Short, NP for Omnipod 5 pump training. PMH significant for T1DM, secondary amenorrhia. Patient is currently using Dexcom G6 CGM. Patient reports taking Tresiba 35 units daily and Humalog (1:3, ISF 1:20, target BG 100) plan. Patient reported following basal insulin taper provided at prior appt on 09/09/22.  Patient presents today with her mother French Ana). She is taking Humalog 12-16 units with her meals (2 meals/day) and 10 units with her snacks (2-3 snacks/day).    Insurance Coverage: BCBS Rx [place]: [id number] Rx PCN: [id number] Rx Group: [id number] ID Number: [id number]  Relationship: [facility] Help Desk: [phone]   [phone] [place] [place] - [address]  [address] [place], [place] [address]  Phone:  [phone]  Fax:  [phone]  DEA #:  [id number]  DAW Reason: --   Omnipod 5 Pump Serial Number: [id number]  Omnipod Education Training Please refer to Omnipod 5 Pod Start Checklist scanned into media  [facility] Account:  [email or web address]!  [facility]der Account:  Quita Skye [phone]!  Objective:  Dexcom Clarity Report    There were no vitals filed for this visit.  HbA1c Lab Results  Component Value Date   HGBA1C 8.9 (A) 09/15/2021   HGBA1C 8.7 (A) 09/09/2021   HGBA1C 7.7 (A) 06/02/2021    Pancreatic Islet Cell Autoantibodies Lab Results  Component Value Date   ISLETAB 1:256 (H) 05/22/2016    Insulin Autoantibodies No results found for: INSULINAB  Glutamic Acid Decarboxylase Autoantibodies Lab Results  Component Value Date   GLUTAMICACAB 14.1 (H) 05/22/2016    ZnT8 Autoantibodies No results found for: ZNT8AB  IA-2 Autoantibodies No results found  for: LABIA2  C-Peptide Lab Results  Component Value Date   CPEPTIDE 0.8 (L) 05/22/2016    Microalbumin Lab Results  Component Value Date   MICRALBCREAT 2 06/02/2021    Lipids    Component Value Date/Time   CHOL 137 06/02/2021 1038   TRIG 77 06/02/2021 1038   HDL 50 06/02/2021 1038   CHOLHDL 2.7 06/02/2021 1038   LDLCALC 71 06/02/2021 1038    Assessment: Pump Settings - Reviewed Dexcom Claity report. She is not at goal TIR >70%. Hypoglycemia tends to occur overnight (between 7-8AM), but not every night. Patient does not administer correction dose before bed. The latest she will eat a snack to bolus is at 9PM. She does not eat breakfast. She is taking ~83 units for her TDD, which is equivalent to ~1.13 units/kg/day. Will increase to 93 units for her TDD (1.26 units/kg/day). Based on rule of 450 ICR should be ~5 and rule of 1800 ISF should be ~20.Will continue current ICR 3 and current ISF. Will change target BG to 110 considering upgrade to hybrid closed loop system.  Medication Samples have been provided to the patient.  Drug name: Novolog 100 units/mL Qty: 1  LOT: [id number]  Exp.Date: 11/23/2002   Pump Education - Omnipod pump applied successfully to back of left arm (within line of sight from Dexcom (also on left arm)). Parents appeared to have sufficient understanding of subjects discussed during Omnipod Training appt.  Plan: Pump Settings  Basal (Max: 3.0 units/hr) 12AM 1.40  6AM 1.55  11PM 1.40  Total: 36.15 units  Insulin to carbohydrate ratio (ICR)  12AM 3  12PM 3  4:30PM 3               Max Bolus: 30 units  Insulin Sensitivity Factor (ISF) 12AM 20                       Target BG 12AM 110                        Omnipod Pump Education:  Continue to wear Omnipod and change pod every 2 days (pod filled 200 units) Thoroughly discussed how to assess bad infusion site change and appropriate management (notice BG is elevated,  attempt to bolus via pump, recheck BG in 30 minutes, if BG has not decreased then disconnect pump and administer bolus via insulin pen, apply new infusion set, and repeat process).  Discussed back up plan if pump breaks (how to calculate insulin doses using insulin pens). Provided written copy of patient's current pump settings and handout explaining math on how to calculate settings. Discussed examples with family. Patient was able to use teach back method to demonstrate understanding of calculating dose for basal/bolus insulin pens from insulin pump settings.  Patient has Lantus and Humalog insulin pen refills to use as back up until 12/2021. Reminded family they will need a new prescription annually.  Reimbursement Uploaded Omnipod 5 Pod Start Checklist and Omnipod Dash Pump Therapy Order Form to [place] Follow Up:  11/03/21  Emailed Omnipod 5 Resource guide to [email or web address] and [email or web address]  This appointment required 120 minutes of patient care (this includes precharting, chart review, review of results, face-to-face care, etc.).  Thank you for involving clinical pharmacist/diabetes educator to assist in providing this patient's care.  [employer], PharmD, BCACP, CDCES, CPP  I have reviewed the following documentation and am in agreeance with the plan. I was immediately available to the clinical pharmacist for questions and collaboration.  Zachery Conch,  FNP-C  Pediatric Specialist  [address]  [place], [place]  Tele: [phone]

## 2021-10-14 ENCOUNTER — Other Ambulatory Visit: Payer: Self-pay

## 2021-10-14 ENCOUNTER — Telehealth (INDEPENDENT_AMBULATORY_CARE_PROVIDER_SITE_OTHER): Payer: Self-pay

## 2021-10-14 ENCOUNTER — Ambulatory Visit (INDEPENDENT_AMBULATORY_CARE_PROVIDER_SITE_OTHER): Payer: BC Managed Care – PPO | Admitting: Pharmacist

## 2021-10-14 ENCOUNTER — Encounter (INDEPENDENT_AMBULATORY_CARE_PROVIDER_SITE_OTHER): Payer: Self-pay | Admitting: Pharmacist

## 2021-10-14 VITALS — Wt 162.4 lb

## 2021-10-14 DIAGNOSIS — E109 Type 1 diabetes mellitus without complications: Secondary | ICD-10-CM

## 2021-10-14 LAB — POCT GLUCOSE (DEVICE FOR HOME USE): POC Glucose: 171 mg/dl — AB (ref 70–99)

## 2021-10-14 MED ORDER — OMNIPOD 5 DEXG7G6 PODS GEN 5 MISC
1.0000 | 4 refills | Status: DC
Start: 2021-10-14 — End: 2022-03-07

## 2021-10-14 NOTE — Patient Instructions (Signed)
It was a pleasure seeing you today!  [facility] Account:  [email or web address]!  [facility]der Account:  Quita Skye [phone]!  If your pump breaks, your long acting insulin dose would be Lantus/Basaglar/Semglee 36 units daily. You would do the following equation for your Novolog/Humalog:  Novolog/Humalog total dose = food dose + correction dose Food dose: total carbohydrates divided by insulin carbohydrate ratio (ICR) Your ICR is 3 for breakfast, 3 for lunch, and 3 for dinner Correction dose: (current blood sugar - target blood sugar) divided by insulin sensitivity factor (ISF) Your ISF is 20. Your target blood sugar is 120 during the day and 180 at night.  PLEASE REMEMBER TO CONTACT OFFICE IF YOU ARE AT RISK OF RUNNING OUT OF PUMP SUPPLIES, INSULIN PEN SUPPLIES, OR IF YOU WANT TO KNOW WHAT YOUR BACK UP INSULIN PEN DOSES ARE.   To summarize our visit, these are the major updates with Omnipod 5:  Automated vs limited vs manual mode Automated mode: this is when the smart pump is turned on and pump will adjust insulin based on Dexcom readings predicted 60 minutes into the future Limited mode: when pump is trying to connect to automated mode, however, there may be issues. For example, when new Dexcom sensor is applied there is a 2 hour warm up period (no CGM readings). Manual mode: this is when the smart pump is NOT turned on and pump goes back to settings put in by provider (kind of like going back to [employer]) You can switch modes by going to settings --> mode --> switch from automated to manual mode or vice versa Why would I switch from automated mode to manual mode? 1. To put in new Dexcom transmitter code (reminder you must do this every 90 days AFTER you update it in Dexcom app) To do this you will change to manual mode --> settings --> CGM transmitter --> enter new code 2. If you get put on steroid medications (e.g., prednisone, methylprednisolone) 3. If you  try activity mode and still experience low blood sugars then you can go to manual mode to turn on a temporary basal rate (decrease 100% in 30 min incrememnts) KEEP IN MIND LINE OF SIGHT WITH DEXCOM! Dexcom and pod must be on the same side of the body. They can be across from each other on the abdomen or lower back/upper buttocks (refer to pages 20 and 21 in resource guide) Make sure to press use CGM rather than type in blood sugar when blousing. When you press use CGM it takes in consideration the Dexcom reading AND arrow.  Omnipod 5 pods will have a clear tab and have Omnipod 5 written on pod compared to Dash pods (blue tab). Omnipod Dash and Omnipod 5 pods cannot be interchangeable. You must solely use Omnipod 5 pods when using Omnipod 5 PDM/app.  If your Omnipod is having issues with receiving Dexcom readings make sure to move the PDM/cellphone closer to the POD (NOT the Dexcom) (refer to page 9 of resource guide to review system communication)  Please contact me (Dr. Ladona Ridgel) at [phone] or via Mychart with any questions/concerns

## 2021-10-14 NOTE — Telephone Encounter (Signed)
[facility]. She states that [place] picked up the humalog vials on 09-14-21. Called back to double check since patient states that she got pens. Cathy at [facility] [employer] states that she did pick up Humalog vials.

## 2021-11-03 ENCOUNTER — Encounter (INDEPENDENT_AMBULATORY_CARE_PROVIDER_SITE_OTHER): Payer: Self-pay | Admitting: Pharmacist

## 2021-11-03 ENCOUNTER — Other Ambulatory Visit (INDEPENDENT_AMBULATORY_CARE_PROVIDER_SITE_OTHER): Payer: BC Managed Care – PPO | Admitting: Pharmacist

## 2021-11-03 ENCOUNTER — Other Ambulatory Visit: Payer: Self-pay

## 2021-12-13 ENCOUNTER — Other Ambulatory Visit: Payer: Self-pay

## 2021-12-13 ENCOUNTER — Encounter (INDEPENDENT_AMBULATORY_CARE_PROVIDER_SITE_OTHER): Payer: Self-pay | Admitting: Family

## 2021-12-13 ENCOUNTER — Ambulatory Visit (INDEPENDENT_AMBULATORY_CARE_PROVIDER_SITE_OTHER): Payer: BC Managed Care – PPO | Admitting: Family

## 2021-12-13 VITALS — BP 118/70 | HR 72 | Wt 164.2 lb

## 2021-12-13 DIAGNOSIS — E1065 Type 1 diabetes mellitus with hyperglycemia: Secondary | ICD-10-CM

## 2021-12-13 DIAGNOSIS — Z9641 Presence of insulin pump (external) (internal): Secondary | ICD-10-CM

## 2021-12-13 LAB — POCT GLUCOSE (DEVICE FOR HOME USE): Glucose Fasting, POC: 126 mg/dL — AB (ref 70–99)

## 2021-12-13 LAB — POCT GLYCOSYLATED HEMOGLOBIN (HGB A1C): Hemoglobin A1C: 7.8 % — AB (ref 4.0–5.6)

## 2021-12-13 NOTE — Progress Notes (Signed)
Pediatric Endocrinology Diabetes Consultation Follow-up Visit  DOLCE SYLVIA 2001-07-10 [id number]  Chief Complaint: Follow-up type 1 diabetes   Aletha Halim., PA-C   HPI: Leslie Mccarthy  is a 21 y.o. female presenting for follow-up of type 1 diabetes. she is accompanied to this visit by her mother.  1. Leslie Mccarthy was diagnosed with type 1 diabetes on May 22, 2016. She had been complaining of polyuria/polydipsia/polyphagia with weight loss of about 16 pounds. She was seen by her PCP who determined that her blood sugar was 462 on POC and 513 on labs. They sent the family to the ER at [facility] where she was found to have type 1 diabetes. She was transitioned to [facility] for admission. She was admitted to the pediatric ward for introduction of insulin and diabetes education. She was started on MDI with Lantus and Novolog. She was transitioned to Humalog based on insurance coverage. She had a hemoglobin a1c of 12.6%. 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  2. Since last visit to [facility] on 04/2021 she has been well.  No ER visits or hospitalizations.  She started Omnipod 5 about 1 month ago, it is going well. She feels like it makes diabetes easier. Reports she is getting use to bolusing, usually before eating. Rotating pump sites to arms right now. She has only had one pump site failure. Dexcom CGM is working well. Hypoglycemia has been rare, she feels sweaty and shaky when low.    Insulin regimen:  (when using injections) 32 units of Lantus,  Humalog 100/20/3 plan _0 Omnipod 5  Basal (Max: 3.0 units/hr) 12AM 1.40  6AM 1.55  11PM 1.40                 Total: 36.15 units   Insulin to carbohydrate ratio (ICR)  12AM 3  12PM 3  4:30PM 3                 Max Bolus: 30 units   Insulin Sensitivity Factor (ISF) 12AM 20                               Target BG 12AM 110                               Hypoglycemia: Able to feel low blood sugars.  No  glucagon needed recently.  Feels sweaty and shaky.  Blood glucose download:   Med-alert IaD: Not currently wearing. Injection sites: Arms, abdomen and legs  Annual labs due: 04/2022 Ophthalmology due: 2019. Discussed importance today.     3. ROS: Greater than 10 systems reviewed with pertinent positives listed in HPI, otherwise neg. Constitutional: Sleeping well. Weight stable.  Eyes: No changes in vision. No blurry vision.  Ears/Nose/Mouth/Throat: No difficulty swallowing. No neck swelling  Cardiovascular: Denies palpitation. No chest pain  Respiratory: No increased work of breathing. No SOB Gastrointestinal: No constipation . Denies abdominal pain.  Genitourinary: No nocturia, no polyuria Neurologic: Normal sensation, no tremor Endocrine: No polydipsia.  No hyperpigmentation Psychiatric: Normal affect. Denies anxiety and depression.   Past Medical History:   History reviewed. No pertinent past medical history.  Medications:  Outpatient Encounter Medications as of 12/13/2021  Medication Sig Note   Continuous Blood Gluc Sensor (DEXCOM G6 SENSOR) MISC Change sensor every 10 days    Continuous Blood Gluc Transmit (DEXCOM G6 TRANSMITTER) MISC  Inject 1 Device into the skin as directed. Change sensor every 90 days   ° doxycycline (VIBRA-TABS) 100 MG tablet Take 100 mg by mouth 2 (two) times daily as needed.   ° Insulin Disposable Pump (OMNIPOD 5 G6 POD, GEN 5,) MISC Inject 1 Device into the skin as directed. Change pod every 2 days. Patient will need 3 boxes (each contain 5 pods) for a 30 day supply. Please fill for NDC [address]-21.   ° insulin lispro (HUMALOG) 100 UNIT/ML injection Inject up to 200 units into insulin pump every 2-3 days per provider guidance. Please fill for insulin VIAL.   ° albuterol (PROVENTIL HFA;VENTOLIN HFA) 108 (90 Base) MCG/ACT inhaler Inhale 2 puffs into the lungs every 6 (six) hours as needed for wheezing or shortness of breath. (Patient not taking: Reported on  09/09/2021)   ° Blood Glucose Monitoring Suppl (ONETOUCH VERIO IQ SYSTEM) w/Device KIT Use to check blood glucose (Patient not taking: Reported on 09/09/2021)   ° Continuous Blood Gluc Receiver (DEXCOM G6 RECEIVER) DEVI 1 Device by Does not apply route as directed. (Patient not taking: Reported on 09/09/2021)   ° Glucagon (BAQSIMI TWO PACK) 3 MG/DOSE POWD Place 3 mg into the nose as needed. (Patient not taking: Reported on 09/09/2021) 12/13/2021: PRN emergencies  ° insulin degludec (TRESIBA FLEXTOUCH) 100 UNIT/ML FlexTouch Pen Inject up to 50 units per day SubQ (Patient not taking: Reported on 12/13/2021)   ° insulin lispro (HUMALOG) 100 UNIT/ML KwikPen Inject up to 50 units per day (Patient not taking: Reported on 09/15/2021)   ° Insulin Pen Needle (B-D UF III MINI PEN NEEDLES) 31G X 5 MM MISC USE AS DIRECTED TO INJECT INSULIN 6 TIMES DAILY (Patient not taking: Reported on 09/15/2021)   ° ONETOUCH VERIO test strip CHECK BLOOD SUGAR 6X DAY (90 DAY SUPPLY) (Patient not taking: Reported on 09/09/2021)   ° [DISCONTINUED] Insulin Disposable Pump (OMNIPOD 5 G6 INTRO, GEN 5,) KIT Inject 1 Device into the skin as directed. Change pod every 2 days. This will be a 30 day supply. Please fill for NDC [address]-01 (Patient not taking: Reported on 09/09/2021)   ° °No facility-administered encounter medications on file as of 12/13/2021.  ° ° °Allergies: °No Known Allergies ° °Surgical History: °History reviewed. No pertinent surgical history. ° °Family History:  °History reviewed. No pertinent family history. ° °  °Social History: °Lives with: Mother and Father  °Graduated. Working as teaching assistant.  ° °Physical Exam:  °Vitals:  ° 12/13/21 1110  °BP: 118/70  °Pulse: 72  °Weight: 164 lb 3.2 oz (74.5 kg)  ° ° ° °BP 118/70    Pulse 72    Wt 164 lb 3.2 oz (74.5 kg)    LMP 12/08/2021    BMI 25.32 kg/m²  °Body mass index: body mass index is 25.32 kg/m². °Blood pressure percentiles are not available for patients who are 18 years or  older. ° °Ht Readings from Last 3 Encounters:  °01/14/21 5' 7.52" (1.715 m) (90 %, Z= 1.29)*  °09/14/20 5' 7.6" (1.717 m) (91 %, Z= 1.32)*  °06/12/20 5' 7.24" (1.708 m) (88 %, Z= 1.19)*  ° °* Growth percentiles are based on CDC (Girls, 2-20 Years) data.  ° °Wt Readings from Last 3 Encounters:  °12/13/21 164 lb 3.2 oz (74.5 kg) (90 %, Z= 1.27)*  °10/14/21 162 lb 6.4 oz (73.7 kg) (89 %, Z= 1.24)*  °09/15/21 163 lb (73.9 kg) (90 %, Z= 1.26)*  ° °* Growth percentiles are based on CDC (Girls,   2-20 Years) data.  ° ° °PHYSICAL EXAM: ° °General: Well developed, well nourished female in no acute distress.   °Head: Normocephalic, atraumatic.   °Eyes:  Pupils equal and round. EOMI.   Sclera white.  No eye drainage.   °Ears/Nose/Mouth/Throat: Nares patent, no nasal drainage.  Normal dentition, mucous membranes moist.   °Neck: supple, no cervical lymphadenopathy, no thyromegaly °Cardiovascular: regular rate, normal S1/S2, no murmurs °Respiratory: No increased work of breathing.  Lungs clear to auscultation bilaterally.  No wheezes. °Abdomen: soft, nontender, nondistended. Normal bowel sounds.  No appreciable masses  °Extremities: warm, well perfused, cap refill < 2 sec.   °Musculoskeletal: Normal muscle mass.  Normal strength °Skin: warm, dry.  No rash or lesions. °Neurologic: alert and oriented, normal speech, no tremor ° ° ° °Labs: °Last hemoglobin A1c: 8.9 % on 08/2021 °Lab Results  °Component Value Date  ° HGBA1C 7.8 (A) 12/13/2021  ° °Results for orders placed or performed in visit on 12/13/21  °POCT Glucose (Device for Home Use)  °Result Value Ref Range  ° Glucose Fasting, POC 126 (A) 70 - 99 mg/dL  ° POC Glucose    °POCT glycosylated hemoglobin (Hb A1C)  °Result Value Ref Range  ° Hemoglobin A1C 7.8 (A) 4.0 - 5.6 %  ° HbA1c POC (<> result, manual entry)    ° HbA1c, POC (prediabetic range)    ° HbA1c, POC (controlled diabetic range)    ° ° °Assessment/Plan: °Rotunda is a 20 y.o. female with uncontrolled type 1 diabetes  recently started on Omnipod 5 insulin pump with Dexcom G6. She is doing well on closed loop insulin pump system with both her hemoglobin A1c and TIR improving. Her hemoglobin A1c has improved to 7.8% today.  ° ° °1. DM w/o complication type I, uncontrolled (HCC)/ °2. Hyperglycemia  °- Reviewed insulin pump and CGM download. Discussed trends and patterns.  °- Rotate pump sites to prevent scar tissue.  °- bolus 15 minutes prior to eating to limit blood sugar spikes.  °- Reviewed carb counting and importance of accurate carb counting.  °- Discussed signs and symptoms of hypoglycemia. Always have glucose available.  °- POCT glucose and hemoglobin A1c  °- Reviewed growth chart.  °- Discussed monitoring for failed insulin pump sites and what to do if pump site failures occur ° °3. Insulin pump titration/pump in place °- No changes to pump settings today.  ° ° °Follow-up:   3 month.  ° ° °>45 spent today reviewing the medical chart, counseling the patient/family, and documenting today's visit.  ° When a patient is on insulin, intensive monitoring of blood glucose levels is necessary to avoid hyperglycemia and hypoglycemia. Severe hyperglycemia/hypoglycemia can lead to hospital admissions and be life threatening.  ° ° ° °Spenser Beasley,  FNP-C  °Pediatric Specialist  °[address]  ° [place], [address]  °Tele: [phone] ° ° ° ° °

## 2021-12-13 NOTE — Patient Instructions (Signed)
It was a pleasure seeing you in clinic today. Please do not hesitate to contact me if you have questions or concerns.  ° °Please sign up for MyChart. This is a communication tool that allows you to send an email directly to me. This can be used for questions, prescriptions and blood sugar reports. We will also release labs to you with instructions on MyChart. Please do not use MyChart if you need immediate or emergency assistance. Ask our wonderful front office staff if you need assistance.  ° °

## 2022-01-04 DIAGNOSIS — E109 Type 1 diabetes mellitus without complications: Secondary | ICD-10-CM | POA: Diagnosis not present

## 2022-01-04 DIAGNOSIS — Z794 Long term (current) use of insulin: Secondary | ICD-10-CM | POA: Diagnosis not present

## 2022-01-04 LAB — HM DIABETES EYE EXAM

## 2022-01-24 ENCOUNTER — Encounter (INDEPENDENT_AMBULATORY_CARE_PROVIDER_SITE_OTHER): Payer: Self-pay

## 2022-01-24 DIAGNOSIS — E1065 Type 1 diabetes mellitus with hyperglycemia: Secondary | ICD-10-CM

## 2022-01-24 MED ORDER — INSULIN LISPRO (1 UNIT DIAL) 100 UNIT/ML (KWIKPEN): PEN_INJECTOR | SUBCUTANEOUS | 1 refills | Status: DC

## 2022-02-02 ENCOUNTER — Encounter: Payer: Self-pay | Admitting: Emergency Medicine

## 2022-02-02 ENCOUNTER — Ambulatory Visit: Payer: BC Managed Care – PPO

## 2022-02-02 ENCOUNTER — Ambulatory Visit
Admission: EM | Admit: 2022-02-02 | Discharge: 2022-02-02 | Disposition: A | Discharge status: Home / Self Care | Payer: BC Managed Care – PPO | Attending: Family Medicine | Admitting: Family Medicine

## 2022-02-02 ENCOUNTER — Ambulatory Visit (INDEPENDENT_AMBULATORY_CARE_PROVIDER_SITE_OTHER): Payer: BC Managed Care – PPO

## 2022-02-02 DIAGNOSIS — S60221A Contusion of right hand, initial encounter: Secondary | ICD-10-CM | POA: Diagnosis not present

## 2022-02-02 DIAGNOSIS — M79641 Pain in right hand: Secondary | ICD-10-CM | POA: Diagnosis not present

## 2022-02-02 HISTORY — DX: Type 2 diabetes mellitus without complications: E11.9

## 2022-02-02 NOTE — ED Provider Notes (Signed)
?[facility] ? ? ? ?CSN: [id number] ?Arrival date & time: 02/02/22  1159 ? ? ?  ? ?History   ?Chief Complaint ?No chief complaint on file. ? ? ?HPI ?Leslie Mccarthy is a 21 y.o. female.  ? ?Patient presenting today with 1 day history of right hand pain, bruising, swelling after punching a wall.  She denies numbness, tingling, loss of range of motion, skin injury.  Has not taken anything for symptoms thus far. ? ? ?Past Medical History:  ?Diagnosis Date  ? Diabetes mellitus without complication ([place])   ? ? ?Patient Active Problem List  ? Diagnosis Date Noted  ? Hypoglycemia due to type 1 diabetes mellitus ([place]) 08/20/2019  ? Amenorrhea, secondary 12/18/2018  ? Constipation in pediatric patient 12/18/2018  ? Maladaptive health behaviors affecting medical condition 12/18/2018  ? Elevated hemoglobin A1c 06/18/2018  ? Insulin dose changed ([place]) 02/08/2018  ? Type 1 diabetes mellitus with other specified complication ([place]) 89/16/9450  ? Adjustment reaction to medical therapy   ? Hyperglycemia 05/22/2016  ? ? ?History reviewed. No pertinent surgical history. ? ?OB History   ?No obstetric history on file. ?  ? ? ? ?Home Medications   ? ?Prior to Admission medications   ?Medication Sig Start Date End Date Taking? Authorizing Provider  ?albuterol (PROVENTIL HFA;VENTOLIN HFA) 108 (90 Base) MCG/ACT inhaler Inhale 2 puffs into the lungs every 6 (six) hours as needed for wheezing or shortness of breath. ?Patient not taking: Reported on 09/09/2021    [provider]  ?Blood Glucose Monitoring Suppl (ONETOUCH VERIO IQ SYSTEM) w/Device KIT Use to check blood glucose ?Patient not taking: Reported on 09/09/2021 06/30/16   Lelon Huh, MD  ?Continuous Blood Gluc Receiver (DEXCOM G6 RECEIVER) DEVI 1 Device by Does not apply route as directed. ?Patient not taking: Reported on 09/09/2021 06/01/20   Hermenia Bers, NP  ?Continuous Blood Gluc Sensor (DEXCOM G6 SENSOR) MISC Change sensor every 10 days 06/02/21   Hermenia Bers, NP  ?Continuous Blood Gluc Transmit (DEXCOM G6 TRANSMITTER) MISC Inject 1 Device into the skin as directed. Change sensor every 90 days 06/29/21   Hermenia Bers, NP  ?doxycycline (VIBRA-TABS) 100 MG tablet Take 100 mg by mouth 2 (two) times daily as needed. 05/24/21   [provider]  ?Glucagon (BAQSIMI TWO PACK) 3 MG/DOSE POWD Place 3 mg into the nose as needed. ?Patient not taking: Reported on 09/09/2021 06/02/21   Hermenia Bers, NP  ?insulin degludec (TRESIBA FLEXTOUCH) 100 UNIT/ML FlexTouch Pen Inject up to 50 units per day SubQ ?Patient not taking: Reported on 12/13/2021 06/02/21   Hermenia Bers, NP  ?Insulin Disposable Pump (OMNIPOD 5 G6 POD, GEN 5,) MISC Inject 1 Device into the skin as directed. Change pod every 2 days. Patient will need 3 boxes (each contain 5 pods) for a 30 day supply. Please fill for [facility] [address]-49. 10/14/21   Levon Hedger, MD  ?insulin lispro (HUMALOG) 100 UNIT/ML injection Inject up to 200 units into insulin pump every 2-3 days per provider guidance. Please fill for insulin VIAL. 09/09/21   Levon Hedger, MD  ?insulin lispro (HUMALOG) 100 UNIT/ML KwikPen Inject up to 50 units per day 01/24/22   Hermenia Bers, NP  ?Insulin Pen Needle (B-D UF III MINI PEN NEEDLES) 31G X 5 MM MISC USE AS DIRECTED TO INJECT INSULIN 6 TIMES DAILY ?Patient not taking: Reported on 09/15/2021 08/16/21   Hermenia Bers, NP  ?ONETOUCH VERIO test strip CHECK BLOOD SUGAR 6X DAY (90 DAY  SUPPLY) ?Patient not taking: Reported on 09/09/2021 01/20/20   Hermenia Bers, NP  ? ? ?Family History ?History reviewed. No pertinent family history. ? ?Social History ?Social History  ? ?Tobacco Use  ? Smoking status: Never  ? Smokeless tobacco: Never  ?Substance Use Topics  ? Alcohol use: No  ? Drug use: No  ? ? ? ?Allergies   ?Patient has no known allergies. ? ? ?Review of Systems ?Review of Systems ?Per HPI ? ?Physical Exam ?Triage Vital Signs ?ED Triage Vitals [02/02/22 1211]   ?Enc Vitals Group  ?   BP 120/80  ?   Pulse Rate 88  ?   Resp 18  ?   Temp 98.2 ?F (36.8 ?C)  ?   Temp Source Oral  ?   SpO2 98 %  ?   Weight   ?   Height   ?   Head Circumference   ?   Peak Flow   ?   Pain Score 8  ?   Pain Loc   ?   Pain Edu?   ?   Excl. in [place]?   ? ?No data found. ? ?Updated Vital Signs ?BP 120/80 (BP Location: Right Arm)   Pulse 88   Temp 98.2 ?F (36.8 ?C) (Oral)   Resp 18   LMP 01/20/2022 (Approximate)   SpO2 98%  ? ?Visual Acuity ?Right Eye Distance:   ?Left Eye Distance:   ?Bilateral Distance:   ? ?Right Eye Near:   ?Left Eye Near:    ?Bilateral Near:    ? ?Physical Exam ?Vitals and nursing note reviewed.  ?Constitutional:   ?   Appearance: Normal appearance. She is not ill-appearing.  ?HENT:  ?   Head: Atraumatic.  ?Eyes:  ?   Extraocular Movements: Extraocular movements intact.  ?   Conjunctiva/sclera: Conjunctivae normal.  ?Cardiovascular:  ?   Rate and Rhythm: Normal rate and regular rhythm.  ?   Heart sounds: Normal heart sounds.  ?Pulmonary:  ?   Effort: Pulmonary effort is normal.  ?   Breath sounds: Normal breath sounds.  ?Musculoskeletal:     ?   General: Swelling, tenderness and signs of injury present. No deformity. Normal range of motion.  ?   Cervical back: Normal range of motion and neck supple.  ?   Comments: Bruising, swelling, tenderness to palpation right fourth and fifth MCP down into carpals.  No bony deformities palpable  ?Skin: ?   General: Skin is warm and dry.  ?   Findings: Bruising and erythema present.  ?Neurological:  ?   Mental Status: She is alert and oriented to person, place, and time.  ?   Comments: Right upper extremity neurovascularly intact  ?Psychiatric:     ?   Mood and Affect: Mood normal.     ?   Thought Content: Thought content normal.     ?   Judgment: Judgment normal.  ? ? ? ?UC Treatments / Results  ?Labs ?(all labs ordered are listed, but only abnormal results are displayed) ?Labs Reviewed - No data to display ? ?EKG ? ? ?Radiology ?DG Hand  Complete Right ? ?Result Date: 02/02/2022 ?CLINICAL DATA:  Punched a wall last night.  Right hand pain. EXAM: RIGHT HAND - COMPLETE 3+ VIEW COMPARISON:  None. FINDINGS: Normal bone mineralization. Joint spaces are preserved. No acute fracture is seen. No dislocation. IMPRESSION: Normal right hand radiographs. Electronically Signed   By: Yvonne Kendall M.D.   On: 02/02/2022 12:27   ? ?  Procedures ?Procedures (including critical care time) ? ?Medications Ordered in UC ?Medications - No data to display ? ?Initial Impression / Assessment and Plan / UC Course  ?I have reviewed the triage vital signs and the nursing notes. ? ?Pertinent labs & imaging results that were available during my care of the patient were reviewed by me and considered in my medical decision making (see chart for details). ? ?  ? ?X-ray of the right hand negative for acute bony abnormality.  Discussed RICE protocol, over-the-counter pain relievers.  Return for worsening symptoms. ? ?Final Clinical Impressions(s) / UC Diagnoses  ? ?Final diagnoses:  ?Contusion of right hand, initial encounter  ? ?Discharge Instructions   ?None ?  ? ?ED Prescriptions   ?None ?  ? ?PDMP not reviewed this encounter. ?  ?Volney American, PA-C ?02/02/22 1308 ? ?

## 2022-02-02 NOTE — ED Triage Notes (Signed)
Punched a wall last night.  Right hand pain  ?

## 2022-02-16 DIAGNOSIS — F329 Major depressive disorder, single episode, unspecified: Secondary | ICD-10-CM | POA: Diagnosis not present

## 2022-02-16 DIAGNOSIS — E1069 Type 1 diabetes mellitus with other specified complication: Secondary | ICD-10-CM | POA: Diagnosis not present

## 2022-02-23 ENCOUNTER — Other Ambulatory Visit (INDEPENDENT_AMBULATORY_CARE_PROVIDER_SITE_OTHER): Payer: Self-pay | Admitting: Family

## 2022-02-23 DIAGNOSIS — E109 Type 1 diabetes mellitus without complications: Secondary | ICD-10-CM

## 2022-02-25 DIAGNOSIS — R739 Hyperglycemia, unspecified: Secondary | ICD-10-CM | POA: Diagnosis not present

## 2022-02-25 DIAGNOSIS — R21 Rash and other nonspecific skin eruption: Secondary | ICD-10-CM | POA: Diagnosis not present

## 2022-02-25 DIAGNOSIS — Y742 Prosthetic and other implants, materials and accessory general hospital and personal-use devices associated with adverse incidents: Secondary | ICD-10-CM | POA: Diagnosis not present

## 2022-02-25 DIAGNOSIS — Z79899 Other long term (current) drug therapy: Secondary | ICD-10-CM | POA: Diagnosis not present

## 2022-02-25 DIAGNOSIS — T85694A Other mechanical complication of insulin pump, initial encounter: Secondary | ICD-10-CM | POA: Diagnosis not present

## 2022-02-25 DIAGNOSIS — E1010 Type 1 diabetes mellitus with ketoacidosis without coma: Secondary | ICD-10-CM | POA: Diagnosis not present

## 2022-02-25 DIAGNOSIS — Z9641 Presence of insulin pump (external) (internal): Secondary | ICD-10-CM | POA: Diagnosis not present

## 2022-02-25 DIAGNOSIS — F32A Depression, unspecified: Secondary | ICD-10-CM | POA: Diagnosis not present

## 2022-02-25 DIAGNOSIS — Z794 Long term (current) use of insulin: Secondary | ICD-10-CM | POA: Diagnosis not present

## 2022-02-25 DIAGNOSIS — R000 Tachycardia, unspecified: Secondary | ICD-10-CM | POA: Diagnosis not present

## 2022-03-07 ENCOUNTER — Encounter (INDEPENDENT_AMBULATORY_CARE_PROVIDER_SITE_OTHER): Payer: Self-pay

## 2022-03-07 ENCOUNTER — Other Ambulatory Visit (INDEPENDENT_AMBULATORY_CARE_PROVIDER_SITE_OTHER): Payer: Self-pay

## 2022-03-07 DIAGNOSIS — E109 Type 1 diabetes mellitus without complications: Secondary | ICD-10-CM

## 2022-03-07 MED ORDER — OMNIPOD 5 DEXG7G6 PODS GEN 5 MISC: 1.0000 | 4 refills | Status: DC

## 2022-03-11 ENCOUNTER — Other Ambulatory Visit (INDEPENDENT_AMBULATORY_CARE_PROVIDER_SITE_OTHER): Payer: Self-pay | Admitting: Pediatric Endocrinology

## 2022-03-11 DIAGNOSIS — E109 Type 1 diabetes mellitus without complications: Secondary | ICD-10-CM

## 2022-03-15 ENCOUNTER — Other Ambulatory Visit (INDEPENDENT_AMBULATORY_CARE_PROVIDER_SITE_OTHER): Payer: Self-pay | Admitting: Pediatrics

## 2022-03-15 DIAGNOSIS — E109 Type 1 diabetes mellitus without complications: Secondary | ICD-10-CM

## 2022-03-24 ENCOUNTER — Ambulatory Visit (INDEPENDENT_AMBULATORY_CARE_PROVIDER_SITE_OTHER): Payer: BC Managed Care – PPO | Admitting: Family

## 2022-03-31 ENCOUNTER — Ambulatory Visit (INDEPENDENT_AMBULATORY_CARE_PROVIDER_SITE_OTHER): Payer: BC Managed Care – PPO | Admitting: Family

## 2022-05-03 ENCOUNTER — Ambulatory Visit (INDEPENDENT_AMBULATORY_CARE_PROVIDER_SITE_OTHER): Payer: BC Managed Care – PPO | Admitting: Family

## 2022-05-03 ENCOUNTER — Encounter (INDEPENDENT_AMBULATORY_CARE_PROVIDER_SITE_OTHER): Payer: Self-pay | Admitting: Family

## 2022-05-03 VITALS — BP 118/70 | HR 54 | Wt 181.4 lb

## 2022-05-03 DIAGNOSIS — Z9641 Presence of insulin pump (external) (internal): Secondary | ICD-10-CM

## 2022-05-03 DIAGNOSIS — E1065 Type 1 diabetes mellitus with hyperglycemia: Secondary | ICD-10-CM

## 2022-05-03 DIAGNOSIS — R739 Hyperglycemia, unspecified: Secondary | ICD-10-CM

## 2022-05-03 LAB — POCT GLUCOSE (DEVICE FOR HOME USE): Glucose Fasting, POC: 134 mg/dL — AB (ref 70–99)

## 2022-05-03 NOTE — Patient Instructions (Signed)
It was a pleasure seeing you in clinic today. Please do not hesitate to contact me if you have questions or concerns.  ° °Please sign up for MyChart. This is a communication tool that allows you to send an email directly to me. This can be used for questions, prescriptions and blood sugar reports. We will also release labs to you with instructions on MyChart. Please do not use MyChart if you need immediate or emergency assistance. Ask our wonderful front office staff if you need assistance.  ° °

## 2022-05-03 NOTE — Progress Notes (Signed)
Pediatric Endocrinology Diabetes Consultation Follow-up Visit  Leslie Mccarthy 2001/09/11 [id number]  Chief Complaint: Follow-up type 1 diabetes   Leslie Halim., PA-C   HPI: Leslie Mccarthy  is a 21 y.o. female presenting for follow-up of type 1 diabetes. she is accompanied to this visit by her mother.  1. Azalee was diagnosed with type 1 diabetes on May 22, 2016. She had been complaining of polyuria/polydipsia/polyphagia with weight loss of about 16 pounds. She was seen by her PCP who determined that her blood sugar was 462 on POC and 513 on labs. They sent the family to the ER at [facility] where she was found to have type 1 diabetes. She was transitioned to [facility] for admission. She was admitted to the pediatric ward for introduction of insulin and diabetes education. She was started on MDI with Lantus and Novolog. She was transitioned to Humalog based on insurance coverage. She had a hemoglobin a1c of 12.6%. 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  2. Since last visit to [facility] on 11/2021  she has been well.    She went into DKA on 02/2022 due to pump site failure. Her hemoglobin A1c was 10.3 at that time.   She has been busy with vacations over the summer. She is using Omnipod 5 insulin pump and Dexcom CGM. Both has been working well and she is happy with them. Rotates pod every 3 days, no lipohypertrophy. She reports that she has been bolusing some but not consistently, she estimates eating about 30-40 grams of carbs per meal. Instead of entering her carbs and blood sugars for pump to do calculations, she enters what she estimates needing. Hypoglycemia occurs rarely, none severe.   Insulin regimen:  (when using injections) 32 units of Lantus,  Humalog 100/20/3 plan \ Omnipod 5  Basal (Max: 3.0 units/hr) 12AM 1.40  6AM 1.55  11PM 1.40                 Total: 36.15 units   Insulin to carbohydrate ratio (ICR)  12AM 3  12PM 3  4:30PM 3                  Max Bolus: 30 units   Insulin Sensitivity Factor (ISF) 12AM 20                               Target BG 12AM 110                               Hypoglycemia: Able to feel low blood sugars.  No glucagon needed recently.  Feels sweaty and shaky.  Blood glucose download:   Med-alert IaD: Not currently wearing. Injection sites: Arms, abdomen and legs  Annual labs due: 05/2022 Ophthalmology due: 11/2022. Discussed importance today.     3. ROS: Greater than 10 systems reviewed with pertinent positives listed in HPI, otherwise neg. Constitutional: Sleeping well. + weight gain  Eyes: No changes in vision. No blurry vision.  Ears/Nose/Mouth/Throat: No difficulty swallowing. No neck swelling  Cardiovascular: Denies palpitation. No chest pain  Respiratory: No increased work of breathing. No SOB Gastrointestinal: No constipation . Denies abdominal pain.  Genitourinary: No nocturia, no polyuria Neurologic: Normal sensation, no tremor Endocrine: No polydipsia.  No hyperpigmentation Psychiatric: Normal affect. Denies anxiety and depression.   Past Medical History:   Past Medical History:  Diagnosis Date  Diabetes mellitus without complication (HCC)     Medications:  Outpatient Encounter Medications as of 05/03/2022  Medication Sig Note   Continuous Blood Gluc Sensor (DEXCOM G6 SENSOR) MISC Change sensor every 10 days    Continuous Blood Gluc Transmit (DEXCOM G6 TRANSMITTER) MISC USE AS DIRECTED FOR 90 DAYS    doxycycline (VIBRA-TABS) 100 MG tablet Take 100 mg by mouth 2 (two) times daily as needed.    Insulin Disposable Pump (OMNIPOD 5 G6 POD, GEN 5,) MISC Inject 1 Device into the skin as directed. Change pod every 2 days. Patient will need 3 boxes (each contain 5 pods) for a 30 day supply. Please fill for Leslie Mccarthy [id number].    insulin lispro (HUMALOG) 100 UNIT/ML injection INJECT UP TO 200 UNITS INTO INSULIN PUMP EVERY 2-3 DAYS PER PROVIDER GUIDANCE.    insulin lispro  (HUMALOG) 100 UNIT/ML KwikPen Inject up to 50 units per day    albuterol (PROVENTIL HFA;VENTOLIN HFA) 108 (90 Base) MCG/ACT inhaler Inhale 2 puffs into the lungs every 6 (six) hours as needed for wheezing or shortness of breath. (Patient not taking: Reported on 09/09/2021)    B-D UF III MINI PEN NEEDLES 31G X 5 MM MISC USE AS DIRECTED TO INJECT INSULIN 6 TIMES DAILY (Patient not taking: Reported on 05/03/2022)    Blood Glucose Monitoring Suppl (ONETOUCH VERIO IQ SYSTEM) w/Device KIT Use to check blood glucose (Patient not taking: Reported on 09/09/2021)    Continuous Blood Gluc Receiver (DEXCOM G6 RECEIVER) DEVI 1 Device by Does not apply route as directed. (Patient not taking: Reported on 09/09/2021)    Glucagon (BAQSIMI TWO PACK) 3 MG/DOSE POWD Place 3 mg into the nose as needed. (Patient not taking: Reported on 09/09/2021) 12/13/2021: PRN emergencies   insulin degludec (TRESIBA FLEXTOUCH) 100 UNIT/ML FlexTouch Pen Inject up to 50 units per day SubQ (Patient not taking: Reported on 12/13/2021)    ONETOUCH VERIO test strip CHECK BLOOD SUGAR 6X DAY (90 DAY SUPPLY) (Patient not taking: Reported on 09/09/2021)    No facility-administered encounter medications on file as of 05/03/2022.    Allergies: No Known Allergies  Surgical History: No past surgical history on file.  Family History:  No family history on file.    Social History: Lives with: Mother and Father  Graduated. Working as Consulting civil engineer.   Physical Exam:  Vitals:   05/03/22 0905  BP: 118/70  Pulse: (!) 54  Weight: 181 lb 6.4 oz (82.3 kg)      BP 118/70   Pulse (!) 54   Wt 181 lb 6.4 oz (82.3 kg)   BMI 27.98 kg/m  Body mass index: body mass index is 27.98 kg/m. Blood pressure %iles are not available for patients who are 18 years or older.  Ht Readings from Last 3 Encounters:  01/14/21 5' 7.52" (1.715 m) (90 %, Z= 1.29)*  09/14/20 5' 7.6" (1.717 m) (91 %, Z= 1.32)*  06/12/20 5' 7.24" (1.708 m) (88 %, Z= 1.19)*    * Growth percentiles are based on CDC (Girls, 2-20 Years) data.   Wt Readings from Last 3 Encounters:  05/03/22 181 lb 6.4 oz (82.3 kg) (95 %, Z= 1.62)*  12/13/21 164 lb 3.2 oz (74.5 kg) (90 %, Z= 1.27)*  10/14/21 162 lb 6.4 oz (73.7 kg) (89 %, Z= 1.24)*   * Growth percentiles are based on CDC (Girls, 2-20 Years) data.    PHYSICAL EXAM:  General: Well developed, well nourished female in no acute distress.   Head: Normocephalic,  atraumatic.   Eyes:  Pupils equal and round. EOMI.   Sclera white.  No eye drainage.   Ears/Nose/Mouth/Throat: Nares patent, no nasal drainage.  Normal dentition, mucous membranes moist.   Neck: supple, no cervical lymphadenopathy, no thyromegaly Cardiovascular: regular rate, normal S1/S2, no murmurs Respiratory: No increased work of breathing.  Lungs clear to auscultation bilaterally.  No wheezes. Abdomen: soft, nontender, nondistended. No appreciable masses  Extremities: warm, well perfused, cap refill < 2 sec.   Musculoskeletal: Normal muscle mass.  Normal strength Skin: warm, dry.  No rash or lesions. Neurologic: alert and oriented, normal speech, no tremor    Labs: Last hemoglobin A1c: 7.8% on 11/2021 Lab Results  Component Value Date   HGBA1C 7.8 (A) 12/13/2021   Results for orders placed or performed in visit on 05/03/22  POCT Glucose (Device for Home Use)  Result Value Ref Range   Glucose Fasting, POC 134 (A) 70 - 99 mg/dL   POC Glucose      Assessment/Plan: Majesty is a 21 y.o. female with uncontrolled type 1 diabetes on Omnipod insulin pump therapy. She is having post prandial hyperglycemia which is likely due to estimating doses instead of entering blood sugar and carbs into pump for calculations. Discussed importance extensively at todays visit. Her TIR is 53% with minimal hypoglycemia.    1. DM w/o complication type I, uncontrolled (HCC)/ 2. Hyperglycemia  - Reviewed insulin pump and CGM download. Discussed trends and patterns.  -  Rotate pump sites to prevent scar tissue.  - bolus 15 minutes prior to eating to limit blood sugar spikes.  - Reviewed carb counting and importance of accurate carb counting.  - Discussed signs and symptoms of hypoglycemia. Always have glucose available.  - POCT glucose - Reviewed growth chart.  - Labs: Lipid panel, TFTs, microalbumin and hemoglobin A1c ordered.   3. Insulin pump titration/pump in place - Advised that she needs to enter blood sugar and carbs into pump everytime she boluses to ensure she is getting the correct doses.  - Discussed signs of pump site failure. If she is running high for more then 4 hours after bolusing, she should change pump site.    Follow-up:   3 month.   LOS: >45 spent today reviewing the medical chart, counseling the patient/family, and documenting today's visit.    When a patient is on insulin, intensive monitoring of blood glucose levels is necessary to avoid hyperglycemia and hypoglycemia. Severe hyperglycemia/hypoglycemia can lead to hospital admissions and be life threatening.     Hermenia Bers,  FNP-C  Pediatric Specialist  [address] [place]  [place], [address]  Tele: [phone]

## 2022-06-09 ENCOUNTER — Ambulatory Visit (INDEPENDENT_AMBULATORY_CARE_PROVIDER_SITE_OTHER): Payer: BC Managed Care – PPO | Admitting: Family

## 2022-07-15 ENCOUNTER — Other Ambulatory Visit (INDEPENDENT_AMBULATORY_CARE_PROVIDER_SITE_OTHER): Payer: Self-pay | Admitting: Family

## 2022-07-15 DIAGNOSIS — E1065 Type 1 diabetes mellitus with hyperglycemia: Secondary | ICD-10-CM

## 2022-07-30 ENCOUNTER — Other Ambulatory Visit (INDEPENDENT_AMBULATORY_CARE_PROVIDER_SITE_OTHER): Payer: Self-pay | Admitting: Family

## 2022-07-30 DIAGNOSIS — E109 Type 1 diabetes mellitus without complications: Secondary | ICD-10-CM

## 2022-08-02 ENCOUNTER — Telehealth (INDEPENDENT_AMBULATORY_CARE_PROVIDER_SITE_OTHER): Payer: Self-pay

## 2022-08-02 NOTE — Telephone Encounter (Signed)
Received fax from pharmacy/covermymeds to complete prior authorization initiated on covermymeds, completed prior authorization  Key: [id number] - PA Case ID: [id number] 08/02/22 - sent to plan

## 2022-08-04 ENCOUNTER — Ambulatory Visit (INDEPENDENT_AMBULATORY_CARE_PROVIDER_SITE_OTHER): Payer: BC Managed Care – PPO | Admitting: Family

## 2022-08-04 ENCOUNTER — Encounter (INDEPENDENT_AMBULATORY_CARE_PROVIDER_SITE_OTHER): Payer: Self-pay | Admitting: Family

## 2022-08-04 VITALS — BP 122/80 | HR 83 | Wt 168.8 lb

## 2022-08-04 DIAGNOSIS — E1065 Type 1 diabetes mellitus with hyperglycemia: Secondary | ICD-10-CM

## 2022-08-04 DIAGNOSIS — Z9641 Presence of insulin pump (external) (internal): Secondary | ICD-10-CM | POA: Diagnosis not present

## 2022-08-04 LAB — POCT GLUCOSE (DEVICE FOR HOME USE): POC Glucose: 154 mg/dl — AB (ref 70–99)

## 2022-08-04 LAB — POCT GLYCOSYLATED HEMOGLOBIN (HGB A1C): Hemoglobin A1C: 7.2 % — AB (ref 4.0–5.6)

## 2022-08-04 NOTE — Telephone Encounter (Signed)
Approved. Determination gave to [place].

## 2022-08-04 NOTE — Progress Notes (Signed)
Pediatric Endocrinology Diabetes Consultation Follow-up Visit  Leslie Mccarthy November 30, 2000 [id number]  Chief Complaint: Follow-up type 1 diabetes   Leslie Halim., PA-C   HPI: Leslie Mccarthy  is a 21 y.o. female presenting for follow-up of type 1 diabetes. she is accompanied to this visit by her mother.  1. [name] was diagnosed with type 1 diabetes on May 22, 2016. She had been complaining of polyuria/polydipsia/polyphagia with weight loss of about 16 pounds. She was seen by her PCP who determined that her blood sugar was 462 on POC and 513 on labs. They sent the family to the ER at [facility] where she was found to have type 1 diabetes. She was transitioned to [facility] for admission. She was admitted to the pediatric ward for introduction of insulin and diabetes education. She was started on MDI with Lantus and Novolog. She was transitioned to Humalog based on insurance coverage. She had a hemoglobin a1c of 12.6%. 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  2. Since last visit to [facility] on 04/2022  she has been well.    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   Reports that diabetes care is similar to last time. Using Omnipod 5 and Dexcom CGM. Occasionally has pods fall off but site problems are rare. When bolusing she reports, "sometimes I forget", usually during work and will remember an hour later. She is confident with carb estimation. Hypoglycemia is rare, none have been severe or needed glucagon.    Insulin regimen:  (when using injections) 32 units of Lantus,  Humalog 100/20/3 plan \ Omnipod 5  Basal (Max: 3.0 units/hr) 12AM 1.40  6AM 1.55  11PM 1.40                 Total: 36.15 units   Insulin to carbohydrate ratio (ICR)  12AM 3  12PM 3  4:30PM 3                 Max Bolus: 30 units   Insulin Sensitivity Factor (ISF) 12AM 20                               Target BG 12AM 110                                Hypoglycemia: Able to feel low blood sugars.  No glucagon needed recently.  Feels sweaty and shaky.  Blood glucose download:   Med-alert IaD: Not currently wearing. Injection sites: Arms, abdomen and legs  Annual labs due: 05/2022 Ophthalmology due: 11/2022. Discussed importance today.     3. ROS: Greater than 10 systems reviewed with pertinent positives listed in HPI, otherwise neg. Constitutional: Sleeping well.  Eyes: No changes in vision. No blurry vision.  Ears/Nose/Mouth/Throat: No difficulty swallowing. No neck swelling  Cardiovascular: Denies palpitation. No chest pain  Respiratory: No increased work of breathing. No SOB Gastrointestinal: No constipation . Denies abdominal pain.  Genitourinary: No nocturia, no polyuria Neurologic: Normal sensation, no tremor Endocrine: No polydipsia.  No hyperpigmentation Psychiatric: Normal affect. Denies anxiety and depression.   Past Medical History:   Past Medical History:  Diagnosis Date   Diabetes mellitus without complication ([place])     Medications:  Outpatient Encounter Medications as of 08/04/2022  Medication Sig Note   Continuous Blood Gluc Sensor (DEXCOM  G6 SENSOR) MISC Change sensor every 10 days    Continuous Blood Gluc Transmit (DEXCOM G6 TRANSMITTER) MISC USE AS DIRECTED FOR 90 DAYS    Insulin Disposable Pump (OMNIPOD 5 G6 POD, GEN 5,) MISC INJECT 1 DEVICE INTO THE SKIN AS DIRECTED. CHANGE POD EVERY 2 DAYS. PATIENT WILL NEED 3 BOXES (EACH CONTAIN 5 PODS) FOR A 30 DAY SUPPLY. [place] [id number].    insulin lispro (HUMALOG) 100 UNIT/ML injection INJECT UP TO 200 UNITS INTO INSULIN PUMP EVERY 2-3 DAYS PER PROVIDER GUIDANCE.    albuterol (PROVENTIL HFA;VENTOLIN HFA) 108 (90 Base) MCG/ACT inhaler Inhale 2 puffs into the lungs every 6 (six) hours as needed for wheezing or shortness of breath. (Patient not taking: Reported on 09/09/2021)    B-D UF III MINI PEN NEEDLES 31G X 5 MM MISC USE AS DIRECTED  TO INJECT INSULIN 6 TIMES DAILY (Patient not taking: Reported on 05/03/2022)    Blood Glucose Monitoring Suppl (ONETOUCH VERIO IQ SYSTEM) w/Device KIT Use to check blood glucose (Patient not taking: Reported on 09/09/2021)    Continuous Blood Gluc Receiver (DEXCOM G6 RECEIVER) DEVI 1 Device by Does not apply route as directed. (Patient not taking: Reported on 09/09/2021)    doxycycline (VIBRA-TABS) 100 MG tablet Take 100 mg by mouth 2 (two) times daily as needed. (Patient not taking: Reported on 08/04/2022)    Glucagon (BAQSIMI TWO PACK) 3 MG/DOSE POWD Place 3 mg into the nose as needed. (Patient not taking: Reported on 09/09/2021) 12/13/2021: PRN emergencies   insulin degludec (TRESIBA FLEXTOUCH) 100 UNIT/ML FlexTouch Pen Inject up to 50 units per day SubQ (Patient not taking: Reported on 12/13/2021)    insulin lispro (HUMALOG KWIKPEN) 100 UNIT/ML KwikPen INJECT UP TO 50 UNITS PER DAY (Patient not taking: Reported on 08/04/2022)    ONETOUCH VERIO test strip CHECK BLOOD SUGAR 6X DAY (90 DAY SUPPLY) (Patient not taking: Reported on 09/09/2021)    No facility-administered encounter medications on file as of 08/04/2022.    Allergies: No Known Allergies  Surgical History: No past surgical history on file.  Family History:  No family history on file.    Social History: Lives with: Mother and Father  Graduated. Working as Consulting civil engineer.   Physical Exam:  Vitals:   08/04/22 1444  BP: 122/80  Pulse: 83  Weight: 168 lb 12.8 oz (76.6 kg)       BP 122/80   Pulse 83   Wt 168 lb 12.8 oz (76.6 kg)   BMI 26.03 kg/m  Body mass index: body mass index is 26.03 kg/m. Blood pressure %iles are not available for patients who are 18 years or older.  Ht Readings from Last 3 Encounters:  01/14/21 5' 7.52" (1.715 m) (90 %, Z= 1.29)*  09/14/20 5' 7.6" (1.717 m) (91 %, Z= 1.32)*  06/12/20 5' 7.24" (1.708 m) (88 %, Z= 1.19)*   * Growth percentiles are based on CDC (Girls, 2-20 Years) data.    Wt Readings from Last 3 Encounters:  08/04/22 168 lb 12.8 oz (76.6 kg) (91 %, Z= 1.35)*  05/03/22 181 lb 6.4 oz (82.3 kg) (95 %, Z= 1.62)*  12/13/21 164 lb 3.2 oz (74.5 kg) (90 %, Z= 1.27)*   * Growth percentiles are based on CDC (Girls, 2-20 Years) data.    PHYSICAL EXAM:  General: Well developed, well nourished female in no acute distress.   Head: Normocephalic, atraumatic.   Eyes:  Pupils equal and round. EOMI.   Sclera white.  No  eye drainage.   Ears/Nose/Mouth/Throat: Nares patent, no nasal drainage.  Normal dentition, mucous membranes moist.   Neck: supple, no cervical lymphadenopathy, no thyromegaly Cardiovascular: regular rate, normal S1/S2, no murmurs Respiratory: No increased work of breathing.  Lungs clear to auscultation bilaterally.  No wheezes. Abdomen: soft, nontender, nondistended. No appreciable masses  Extremities: warm, well perfused, cap refill < 2 sec.   Musculoskeletal: Normal muscle mass.  Normal strength Skin: warm, dry.  No rash or lesions. Neurologic: alert and oriented, normal speech, no tremor    Labs: Last hemoglobin A1c:10/8 % on 02/2022 Lab Results  Component Value Date   HGBA1C 7.2 (A) 08/04/2022   Results for orders placed or performed in visit on 08/04/22  POCT glycosylated hemoglobin (Hb A1C)  Result Value Ref Range   Hemoglobin A1C 7.2 (A) 4.0 - 5.6 %   HbA1c POC (<> result, manual entry)     HbA1c, POC (prediabetic range)     HbA1c, POC (controlled diabetic range)    POCT Glucose (Device for Home Use)  Result Value Ref Range   Glucose Fasting, POC     POC Glucose 154 (A) 70 - 99 mg/dl    Assessment/Plan: Adriannah is a 21 y.o. female with uncontrolled type 1 diabetes on Omnipod insulin pump therapy. Has made improvement with blood glucose control and diabetes care. Hemoglobin A1c has improved to 7.2% today. Her TIR has increased to 58% in target and minimal hypoglycemia.    1. DM w/o complication type I, uncontrolled (HCC)/ 2.  Hyperglycemia  - Reviewed insulin pump and CGM download. Discussed trends and patterns.  - Rotate pump sites to prevent scar tissue.  - bolus 15 minutes prior to eating to limit blood sugar spikes.  - Reviewed carb counting and importance of accurate carb counting.  - Discussed signs and symptoms of hypoglycemia. Always have glucose available.  - POCT glucose and hemoglobin A1c  - Reviewed growth chart.  - Advised to limit intake of sugar drinks (dr. Malachi Bonds)  - Declined influenza vaccine today.  - Annual labs at next visit.   3. Insulin pump titration/pump in place -Pump in place, no changes today.    Follow-up:   3 month.   LOS: >40  spent today reviewing the medical chart, counseling the patient/family, and documenting today's visit.    When a patient is on insulin, intensive monitoring of blood glucose levels is necessary to avoid hyperglycemia and hypoglycemia. Severe hyperglycemia/hypoglycemia can lead to hospital admissions and be life threatening.     Hermenia Bers,  FNP-C  Pediatric Specialist  [address] [place]  [place], [address]  Tele: [phone]

## 2022-08-04 NOTE — Patient Instructions (Signed)
It was a pleasure seeing you in clinic today. Please do not hesitate to contact me if you have questions or concerns.  ° °Please sign up for MyChart. This is a communication tool that allows you to send an email directly to me. This can be used for questions, prescriptions and blood sugar reports. We will also release labs to you with instructions on MyChart. Please do not use MyChart if you need immediate or emergency assistance. Ask our wonderful front office staff if you need assistance.  ° °

## 2022-08-07 ENCOUNTER — Encounter (INDEPENDENT_AMBULATORY_CARE_PROVIDER_SITE_OTHER): Payer: Self-pay

## 2022-08-07 ENCOUNTER — Other Ambulatory Visit (INDEPENDENT_AMBULATORY_CARE_PROVIDER_SITE_OTHER): Payer: Self-pay | Admitting: Family

## 2022-08-07 DIAGNOSIS — E109 Type 1 diabetes mellitus without complications: Secondary | ICD-10-CM

## 2022-08-08 ENCOUNTER — Other Ambulatory Visit (INDEPENDENT_AMBULATORY_CARE_PROVIDER_SITE_OTHER): Payer: Self-pay

## 2022-08-08 DIAGNOSIS — E109 Type 1 diabetes mellitus without complications: Secondary | ICD-10-CM

## 2022-08-08 IMAGING — DX DG HAND COMPLETE 3+V*R*
3 series · 3 of 3 positions shown · non-contrast
Comparison: None.

CLINICAL DATA: Punched a wall last night.  Right hand pain.

EXAM:
RIGHT HAND - COMPLETE 3+ VIEW

[hand pa]
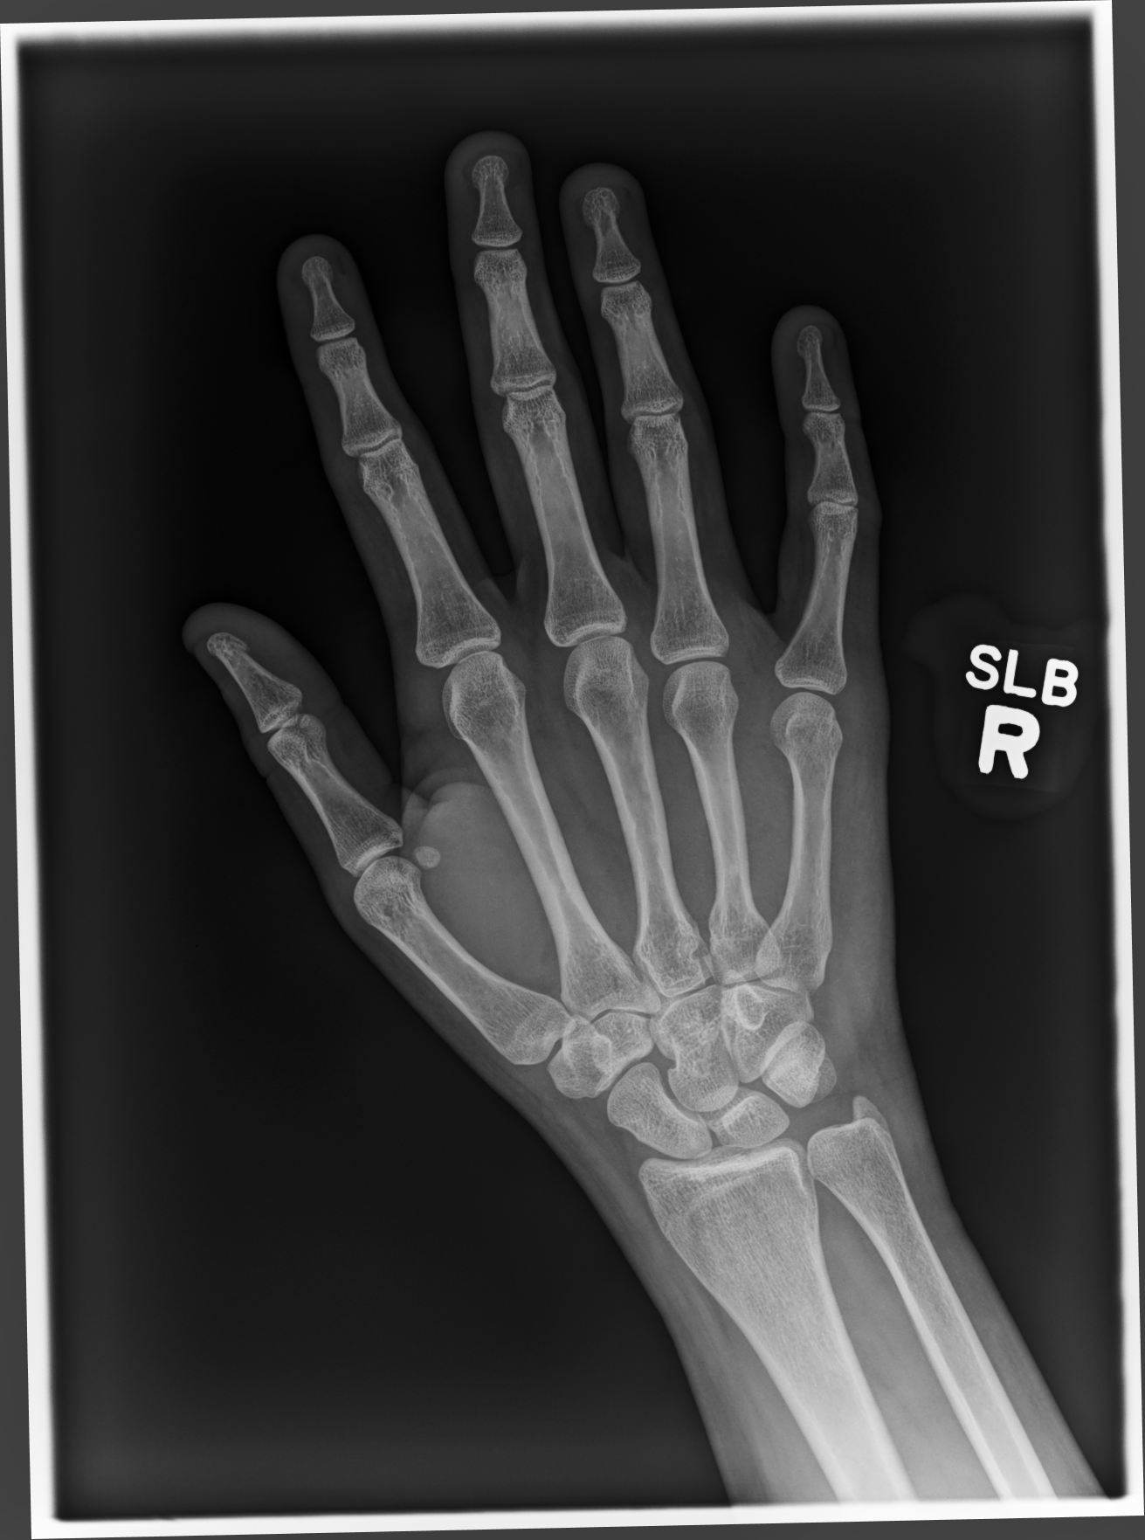

[hand mlo]
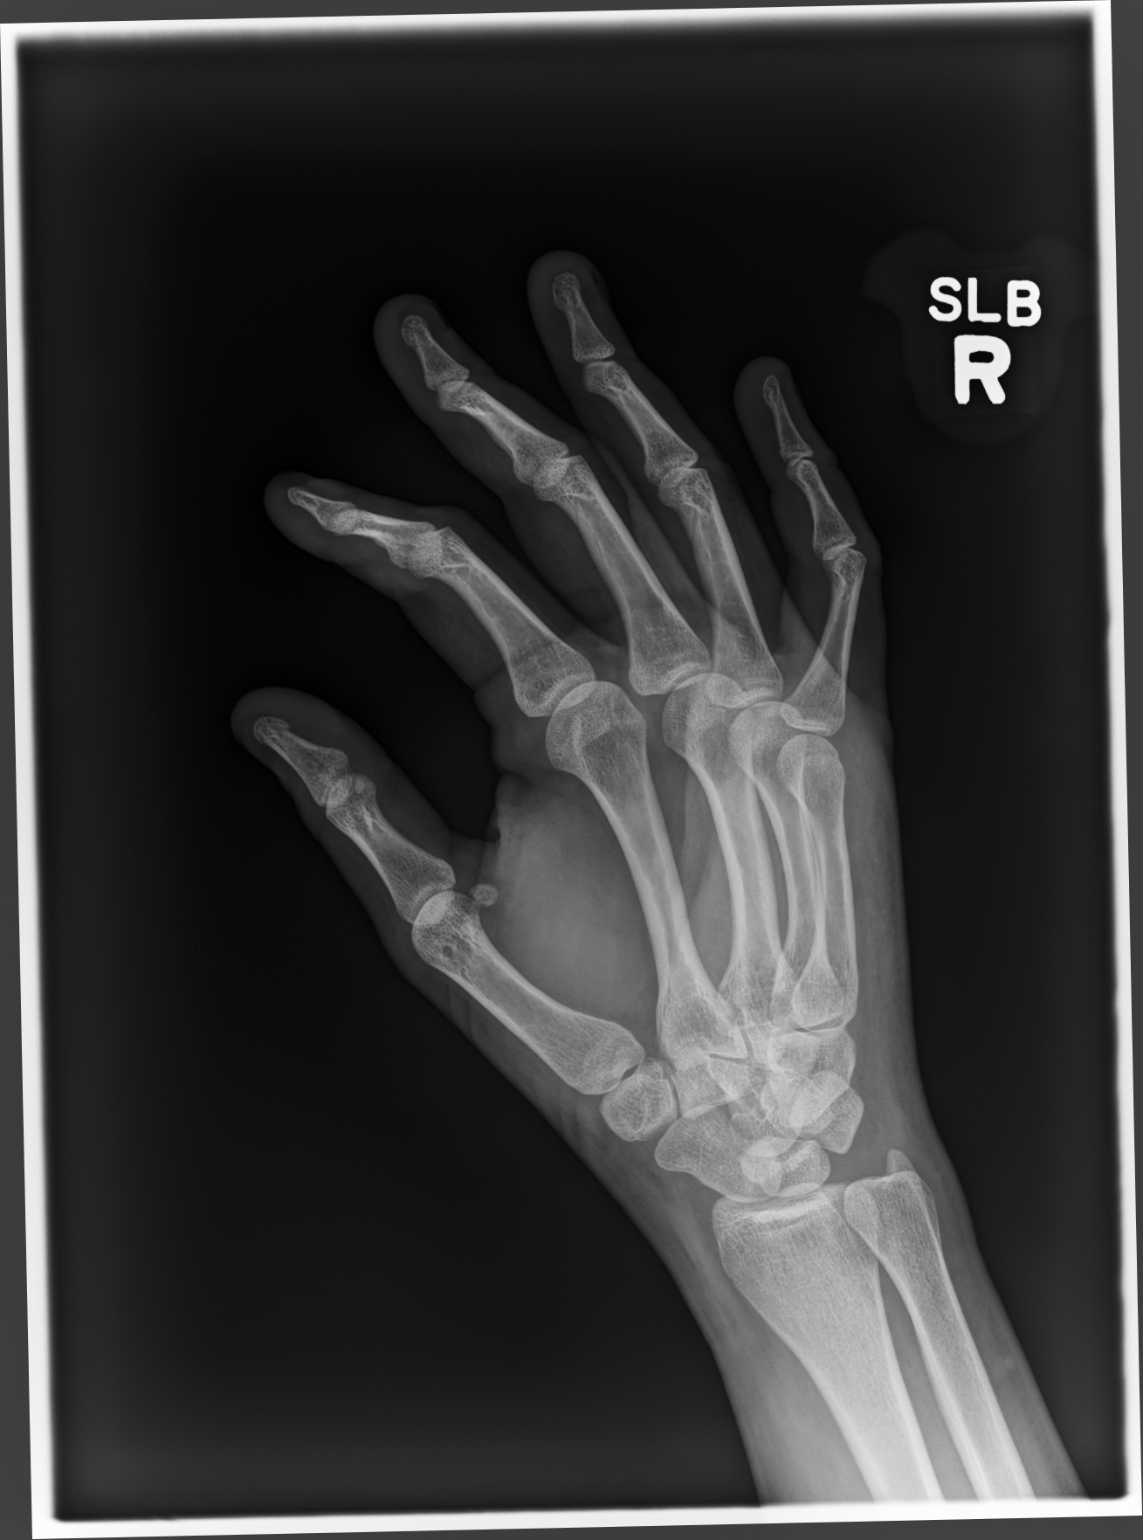

[hand lat]
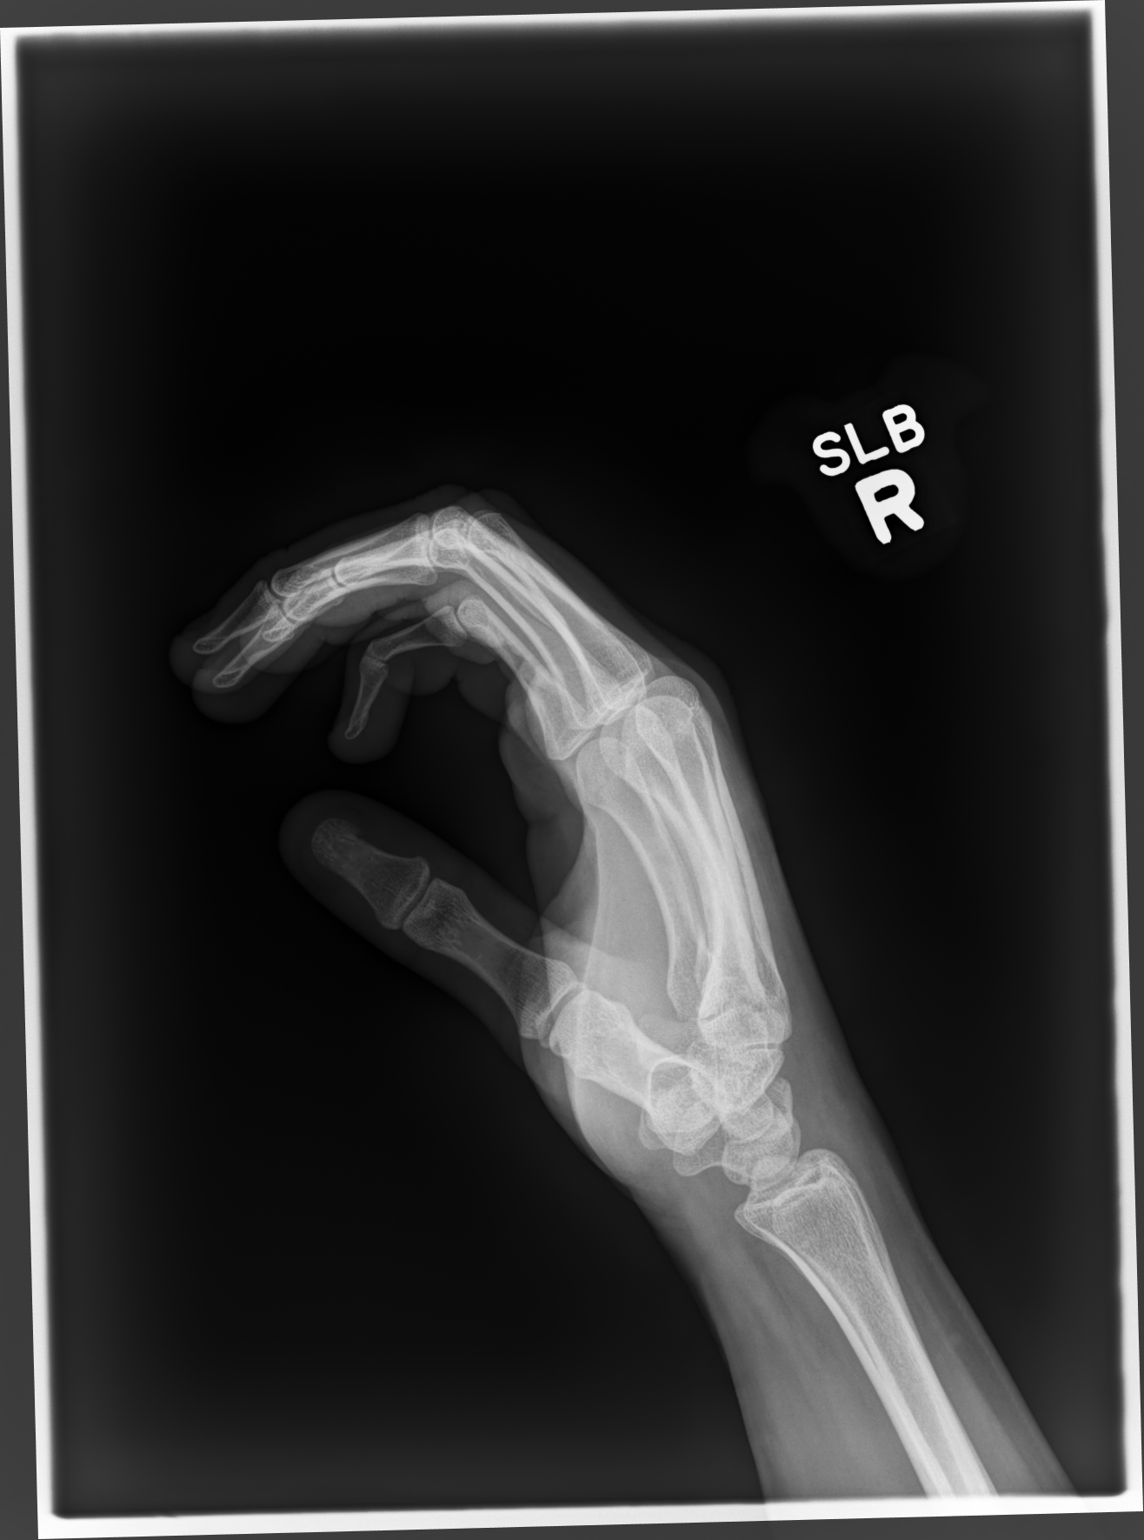

[3 of 3 positions shown; findings below may reference images not displayed]

FINDINGS: Normal bone mineralization. Joint spaces are preserved. No acute
fracture is seen. No dislocation.
IMPRESSION: Normal right hand radiographs.

## 2022-08-08 MED ORDER — DEXCOM G6 TRANSMITTER MISC: 3 refills | Status: DC

## 2022-08-08 MED ORDER — DEXCOM G6 SENSOR MISC: 3 refills | Status: DC

## 2022-08-10 DIAGNOSIS — J0100 Acute maxillary sinusitis, unspecified: Secondary | ICD-10-CM | POA: Diagnosis not present

## 2022-08-17 ENCOUNTER — Other Ambulatory Visit (INDEPENDENT_AMBULATORY_CARE_PROVIDER_SITE_OTHER): Payer: Self-pay | Admitting: Family

## 2022-08-17 ENCOUNTER — Encounter (INDEPENDENT_AMBULATORY_CARE_PROVIDER_SITE_OTHER): Payer: Self-pay

## 2022-08-17 DIAGNOSIS — E109 Type 1 diabetes mellitus without complications: Secondary | ICD-10-CM

## 2022-08-18 ENCOUNTER — Telehealth (INDEPENDENT_AMBULATORY_CARE_PROVIDER_SITE_OTHER): Payer: Self-pay | Admitting: Family

## 2022-08-18 ENCOUNTER — Encounter (INDEPENDENT_AMBULATORY_CARE_PROVIDER_SITE_OTHER): Payer: Self-pay

## 2022-08-18 NOTE — Telephone Encounter (Signed)
Mom came and dropped [employer] forms off to be signed. Placed forms in [place]. Call mom when forms are finished. [phone]

## 2022-08-19 NOTE — Telephone Encounter (Signed)
Faxed to [facility] and sent patient copy through mychart.

## 2022-09-08 ENCOUNTER — Encounter (INDEPENDENT_AMBULATORY_CARE_PROVIDER_SITE_OTHER): Payer: Self-pay

## 2022-09-09 ENCOUNTER — Ambulatory Visit (INDEPENDENT_AMBULATORY_CARE_PROVIDER_SITE_OTHER): Payer: BC Managed Care – PPO

## 2022-09-09 ENCOUNTER — Ambulatory Visit
Admission: EM | Admit: 2022-09-09 | Discharge: 2022-09-09 | Disposition: A | Discharge status: Home / Self Care | Payer: BC Managed Care – PPO | Attending: Family Medicine | Admitting: Family Medicine

## 2022-09-09 ENCOUNTER — Other Ambulatory Visit (INDEPENDENT_AMBULATORY_CARE_PROVIDER_SITE_OTHER): Payer: Self-pay

## 2022-09-09 DIAGNOSIS — S92334A Nondisplaced fracture of third metatarsal bone, right foot, initial encounter for closed fracture: Secondary | ICD-10-CM | POA: Diagnosis not present

## 2022-09-09 DIAGNOSIS — S92341A Displaced fracture of fourth metatarsal bone, right foot, initial encounter for closed fracture: Secondary | ICD-10-CM | POA: Diagnosis not present

## 2022-09-09 DIAGNOSIS — S92344A Nondisplaced fracture of fourth metatarsal bone, right foot, initial encounter for closed fracture: Secondary | ICD-10-CM | POA: Diagnosis not present

## 2022-09-09 DIAGNOSIS — M79671 Pain in right foot: Secondary | ICD-10-CM

## 2022-09-09 DIAGNOSIS — S92324A Nondisplaced fracture of second metatarsal bone, right foot, initial encounter for closed fracture: Secondary | ICD-10-CM | POA: Diagnosis not present

## 2022-09-09 NOTE — ED Provider Notes (Signed)
[facility]    CSN: [id number] Arrival date & time: 09/09/22  1627      History   Chief Complaint No chief complaint on file.   HPI Leslie Mccarthy is a 21 y.o. female.   Patient presenting today with severe right midfoot pain, swelling after a zip lining incident earlier today where she hit her foot on a tree.  She is unable to bear weight on the foot and has some cramping-like feelings.  Denies discoloration, loss of sensation, coolness, injury elsewhere.  So far has not been trying anything over-the-counter for symptoms as the incident just occurred.    Past Medical History:  Diagnosis Date   Diabetes mellitus without complication [facility])     Patient Active Problem List   Diagnosis Date Noted   Hypoglycemia due to type 1 diabetes mellitus ([place]) 08/20/2019   Amenorrhea, secondary 12/18/2018   Constipation in pediatric patient 12/18/2018   Type 1 diabetes mellitus with other specified complication ([place]) 19/41/7408   Hyperglycemia 05/22/2016    History reviewed. No pertinent surgical history.  OB History   No obstetric history on file.      Home Medications    Prior to Admission medications   Medication Sig Start Date End Date Taking? Authorizing Provider  albuterol (PROVENTIL HFA;VENTOLIN HFA) 108 (90 Base) MCG/ACT inhaler Inhale 2 puffs into the lungs every 6 (six) hours as needed for wheezing or shortness of breath. Patient not taking: Reported on 09/09/2021    [provider]  B-D UF III MINI PEN NEEDLES 31G X 5 MM MISC USE AS DIRECTED TO INJECT INSULIN 6 TIMES DAILY 08/17/22   Hermenia Bers, NP  Blood Glucose Monitoring Suppl (ONETOUCH VERIO IQ SYSTEM) w/Device KIT Use to check blood glucose Patient not taking: Reported on 09/09/2021 06/30/16   Lelon Huh, MD  Continuous Blood Gluc Receiver (DEXCOM G6 RECEIVER) DEVI 1 Device by Does not apply route as directed. Patient not taking: Reported on 09/09/2021 06/01/20   Hermenia Bers, NP   Continuous Blood Gluc Sensor (DEXCOM G6 SENSOR) MISC Change sensor every 10 days 08/08/22   Hermenia Bers, NP  Continuous Blood Gluc Transmit (DEXCOM G6 TRANSMITTER) MISC USE AS DIRECTED FOR 90 DAYS 08/08/22   Hermenia Bers, NP  doxycycline (VIBRA-TABS) 100 MG tablet Take 100 mg by mouth 2 (two) times daily as needed. Patient not taking: Reported on 08/04/2022 05/24/21   [provider]  Glucagon (BAQSIMI TWO PACK) 3 MG/DOSE POWD Place 3 mg into the nose as needed. Patient not taking: Reported on 09/09/2021 06/02/21   Hermenia Bers, NP  insulin degludec (TRESIBA FLEXTOUCH) 100 UNIT/ML FlexTouch Pen Inject up to 50 units per day SubQ Patient not taking: Reported on 12/13/2021 06/02/21   Hermenia Bers, NP  Insulin Disposable Pump (OMNIPOD 5 G6 POD, GEN 5,) MISC INJECT 1 DEVICE INTO THE SKIN AS DIRECTED. CHANGE POD EVERY 2 DAYS. PATIENT WILL NEED 3 BOXES (EACH CONTAIN 5 PODS) FOR A 30 DAY SUPPLY. PLEASE FILL FOR [facility] [id number]. 08/01/22   Hermenia Bers, NP  insulin lispro (HUMALOG KWIKPEN) 100 UNIT/ML KwikPen INJECT UP TO 50 UNITS PER DAY Patient not taking: Reported on 08/04/2022 07/15/22   Hermenia Bers, NP  insulin lispro (HUMALOG) 100 UNIT/ML injection INJECT UP TO 200 UNITS INTO INSULIN PUMP EVERY 2-3 DAYS PER PROVIDER GUIDANCE. 03/15/22   Hermenia Bers, NP  ONETOUCH VERIO test strip CHECK BLOOD SUGAR 6X DAY (90 DAY SUPPLY) Patient not taking: Reported on 09/09/2021 01/20/20   Hermenia Bers, NP  Family History History reviewed. No pertinent family history.  Social History Social History   Tobacco Use   Smoking status: Never   Smokeless tobacco: Never  Substance Use Topics   Alcohol use: No   Drug use: No     Allergies   Patient has no known allergies.   Review of Systems Review of Systems Per HPI  Physical Exam Triage Vital Signs ED Triage Vitals  Enc Vitals Group     BP 09/09/22 1634 113/74     Pulse Rate 09/09/22 1634 93     Resp  09/09/22 1634 20     Temp 09/09/22 1634 97.7 F (36.5 C)     Temp Source 09/09/22 1634 Oral     SpO2 09/09/22 1634 97 %     Weight --      Height --      Head Circumference --      Peak Flow --      Pain Score 09/09/22 1636 8     Pain Loc --      Pain Edu? --      Excl. in [place]? --    No data found.  Updated Vital Signs BP 113/74 (BP Location: Right Arm)   Pulse 93   Temp 97.7 F (36.5 C) (Oral)   Resp 20   LMP 09/02/2022   SpO2 97%   Visual Acuity Right Eye Distance:   Left Eye Distance:   Bilateral Distance:    Right Eye Near:   Left Eye Near:    Bilateral Near:     Physical Exam Vitals and nursing note reviewed.  Constitutional:      Appearance: Normal appearance. She is not ill-appearing.  HENT:     Head: Atraumatic.  Eyes:     Extraocular Movements: Extraocular movements intact.     Conjunctiva/sclera: Conjunctivae normal.  Cardiovascular:     Rate and Rhythm: Normal rate and regular rhythm.     Heart sounds: Normal heart sounds.  Pulmonary:     Effort: Pulmonary effort is normal.     Breath sounds: Normal breath sounds.  Musculoskeletal:        General: Swelling, tenderness and signs of injury present.     Cervical back: Normal range of motion and neck supple.     Comments: Range of motion significantly limited to the right foot, severe tenderness to palpation over right midfoot.  Unable to bear weight on the right foot  Skin:    General: Skin is warm and dry.  Neurological:     Mental Status: She is alert and oriented to person, place, and time.     Comments: Right foot neurovascularly intact  Psychiatric:        Mood and Affect: Mood normal.        Thought Content: Thought content normal.        Judgment: Judgment normal.      UC Treatments / Results  Labs (all labs ordered are listed, but only abnormal results are displayed) Labs Reviewed - No data to display  EKG   Radiology DG Foot Complete Right  Result Date: 09/09/2022 CLINICAL  DATA:  Hit foot on tree. Was the planning and hit foot entry about 2 hours ago. Right foot redness and pain. EXAM: RIGHT FOOT COMPLETE - 3+ VIEW COMPARISON:  None Available. FINDINGS: There is a transverse fracture of the proximal shaft of the fourth metatarsal with up to 2 mm lateral displacement of the distal fracture component with respect to  the proximal fracture component. Subtle transverse linear lucency within the medial aspect of the proximal metaphysis of third metatarsal, an additional acute nondisplaced fracture. Possible acute nondisplaced fracture of the medial aspect of the proximal metaphysis of the second metatarsal. Joint spaces are preserved.  No dislocation. IMPRESSION: 1. Minimally displaced fracture of the proximal shaft of the fourth metatarsal. 2. Acute nondisplaced fractures of the proximal metaphyses of the third and second metatarsals. Electronically Signed   By: Yvonne Kendall M.D.   On: 09/09/2022 16:56    Procedures Procedures (including critical care time)  Medications Ordered in UC Medications - No data to display  Initial Impression / Assessment and Plan / UC Course  I have reviewed the triage vital signs and the nursing notes.  Pertinent labs & imaging results that were available during my care of the patient were reviewed by me and considered in my medical decision making (see chart for details).     X-ray today showing fractures to the second, third, fourth metatarsals with displacement of the fourth metatarsal fracture.  Will place in a cam boot and give crutches, patient is informed to be nonweightbearing until follow-up with orthopedics first thing next week for further instruction and management.  RICE protocol, ibuprofen and Tylenol, work note given.  Final Clinical Impressions(s) / UC Diagnoses   Final diagnoses:  Closed displaced fracture of fourth metatarsal bone of right foot, initial encounter  Closed nondisplaced fracture of third metatarsal bone of  right foot, initial encounter  Closed nondisplaced fracture of second metatarsal bone of right foot, initial encounter   Discharge Instructions   None    ED Prescriptions   None    PDMP not reviewed this encounter.   Volney American, [place] 09/09/22 1720

## 2022-09-09 NOTE — ED Triage Notes (Signed)
Pt reports she was zip lining and hit her foot on a tree about 2 hrs ago. Pt presents with right foot redness and pain. She has a cramp like feeling in her r foot. Limited motion flexing downward and upward is no flexibility. Hard to walk and put pressure on it. Hurts more near the interior part of right foot.   Pt also slipped on liquid substance on floor. Fell on same foot.

## 2022-09-14 ENCOUNTER — Encounter: Payer: Self-pay | Admitting: Orthopaedic Surgery

## 2022-09-14 ENCOUNTER — Ambulatory Visit (INDEPENDENT_AMBULATORY_CARE_PROVIDER_SITE_OTHER): Payer: BC Managed Care – PPO | Admitting: Orthopaedic Surgery

## 2022-09-14 VITALS — BP 100/60 | HR 87 | Ht 67.5 in | Wt 160.0 lb

## 2022-09-14 DIAGNOSIS — S92334A Nondisplaced fracture of third metatarsal bone, right foot, initial encounter for closed fracture: Secondary | ICD-10-CM

## 2022-09-14 DIAGNOSIS — S92324A Nondisplaced fracture of second metatarsal bone, right foot, initial encounter for closed fracture: Secondary | ICD-10-CM | POA: Diagnosis not present

## 2022-09-14 DIAGNOSIS — S92344A Nondisplaced fracture of fourth metatarsal bone, right foot, initial encounter for closed fracture: Secondary | ICD-10-CM | POA: Diagnosis not present

## 2022-09-14 MED ORDER — HYDROCODONE-ACETAMINOPHEN 5-325 MG PO TABS: ORAL_TABLET | ORAL | 0 refills | Status: DC

## 2022-09-14 NOTE — Patient Instructions (Addendum)
Soak your foot as directed by Dr.Keeling. Follow the instructions for the Contrast Bath that we provided for you.   Check your foot well every day especially since you are a diabetic  The pain medication Dr.Keeling prescribed for you will make you sleepy. Do not drive while you are taking the medication.   As the weather changes and gets cooler, you may notice you are affected more. You may have more pain in your joints. This is normal. Dress warmly and make sure that area is covered well.   Dr.Keeling is here all day on Tuesdays, Wednesday mornings, and Thursday mornings. If you need anything such as a medication refill, please either call BEFORE the end of the day on [facility] or send a message through [place]. Your pharmacy can send a refill request for you. Calling by the end of the day on [facility] allows [place] time to send Dr.Keeling the request and for him to respond before he leaves on Thursdays.   MY NAME IS ABBY AND I AM DR.KEELING'S ASSISTANT. IF YOU NEED ANYTHING, PLEASE DO NOT HESITATE TO EITHER SEND ME A MESSAGE VIA MYCHART OR CALL THE OFFICE [phone] AND LEAVE A MESSAGE FOR ME. I WILL RESPOND WITHIN 24-48 BUSINESS HOURS.   Try not to overdue it. Your foot will tell you when you're doing too much. Wear the boot all the time except when you're sleeping or watching tv. If you are sitting watching TV make sure to prop your foot up.

## 2022-09-14 NOTE — Progress Notes (Signed)
Subjective:    Patient ID: Leslie Mccarthy, female    DOB: 07-18-2001, 21 y.o.   MRN: [id number]  HPI She hurt her right foot on 09-09-22 while zip lining on a home made zip line.  She used her foot to stop her descend.  She was seen in the ER shortly after the accident.  X-rays showed: IMPRESSION: 1. Minimally displaced fracture of the proximal shaft of the fourth metatarsal. 2. Acute nondisplaced fractures of the proximal metaphyses of the third and second metatarsals.   She was given ibuprofen, crutches and CAM walker. She had no other injury.  I have reviewed the ER notes and X-rays.  I have independently reviewed and interpreted x-rays of this patient done at another site by another physician or qualified health professional.  She has pain.  She has been using the crutches well.   Review of Systems  Constitutional:  Positive for activity change.  Musculoskeletal:  Positive for arthralgias, gait problem and joint swelling.  All other systems reviewed and are negative. For Review of Systems, all other systems reviewed and are negative.  The following is a summary of the past history medically, past history surgically, known current medicines, social history and family history.  This information is gathered electronically by the computer from prior information and documentation.  I review this each visit and have found including this information at this point in the chart is beneficial and informative.   Past Medical History:  Diagnosis Date   Diabetes mellitus without complication ([place])     History reviewed. No pertinent surgical history.  Current Outpatient Medications on File Prior to Visit  Medication Sig Dispense Refill   B-D UF III MINI PEN NEEDLES 31G X 5 MM MISC USE AS DIRECTED TO INJECT INSULIN 6 TIMES DAILY 600 each 1   Continuous Blood Gluc Sensor (DEXCOM G6 SENSOR) MISC Change sensor every 10 days 9 each 3   Continuous Blood Gluc Transmit (DEXCOM G6  TRANSMITTER) MISC USE AS DIRECTED FOR 90 DAYS 1 each 3   Insulin Disposable Pump (OMNIPOD 5 G6 POD, GEN 5,) MISC INJECT 1 DEVICE INTO THE SKIN AS DIRECTED. CHANGE POD EVERY 2 DAYS. PATIENT WILL NEED 3 BOXES (EACH CONTAIN 5 PODS) FOR A 30 DAY SUPPLY. PLEASE FILL FOR [facility] [address]-75. 15 each 4   insulin lispro (HUMALOG) 100 UNIT/ML injection INJECT UP TO 200 UNITS INTO INSULIN PUMP EVERY 2-3 DAYS PER PROVIDER GUIDANCE. 30 mL 5   ONETOUCH VERIO test strip CHECK BLOOD SUGAR 6X DAY (90 DAY SUPPLY) 600 strip 1   albuterol (PROVENTIL HFA;VENTOLIN HFA) 108 (90 Base) MCG/ACT inhaler Inhale 2 puffs into the lungs every 6 (six) hours as needed for wheezing or shortness of breath. (Patient not taking: Reported on 09/09/2021)     Blood Glucose Monitoring Suppl (ONETOUCH VERIO IQ SYSTEM) w/Device KIT Use to check blood glucose (Patient not taking: Reported on 09/09/2021) 2 kit 6   Continuous Blood Gluc Receiver (DEXCOM G6 RECEIVER) DEVI 1 Device by Does not apply route as directed. (Patient not taking: Reported on 09/09/2021) 1 each 2   Glucagon (BAQSIMI TWO PACK) 3 MG/DOSE POWD Place 3 mg into the nose as needed. (Patient not taking: Reported on 09/09/2021) 2 each 3   insulin lispro (HUMALOG KWIKPEN) 100 UNIT/ML KwikPen INJECT UP TO 50 UNITS PER DAY (Patient not taking: Reported on 09/14/2022) 45 mL 1   No current facility-administered medications on file prior to visit.    Social History   Socioeconomic  History   Marital status: Single    Spouse name: Not on file   Number of children: Not on file   Years of education: Not on file   Highest education level: Not on file  Occupational History   Not on file  Tobacco Use   Smoking status: Never   Smokeless tobacco: Never  Substance and Sexual Activity   Alcohol use: No   Drug use: No   Sexual activity: Never  Other Topics Concern   Not on file  Social History Narrative   Lives with parents and 2 sisters. No smokers. 2 outside pets.   Social  Determinants of Health   Financial Resource Strain: Not on file  Food Insecurity: Not on file  Transportation Needs: Not on file  Physical Activity: Not on file  Stress: Not on file  Social Connections: Not on file  Intimate Partner Violence: Not on file    History reviewed. No pertinent family history.  BP 100/60   Pulse 87   Ht 5' 7.5" (1.715 m)   Wt 160 lb (72.6 kg)   LMP 09/02/2022   SpO2 97%   BMI 24.69 kg/m   Body mass index is 24.69 kg/m.        Objective:   Physical Exam Vitals and nursing note reviewed. Exam conducted with a chaperone present.  Constitutional:      Appearance: She is well-developed.  HENT:     Head: Normocephalic and atraumatic.  Eyes:     Conjunctiva/sclera: Conjunctivae normal.     Pupils: Pupils are equal, round, and reactive to light.  Cardiovascular:     Rate and Rhythm: Normal rate and regular rhythm.  Pulmonary:     Effort: Pulmonary effort is normal.  Abdominal:     Palpations: Abdomen is soft.  Musculoskeletal:     Cervical back: Normal range of motion and neck supple.       Feet:  Skin:    General: Skin is warm and dry.  Neurological:     Mental Status: She is alert and oriented to person, place, and time.     Cranial Nerves: No cranial nerve deficit.     Motor: No abnormal muscle tone.     Coordination: Coordination normal.     Deep Tendon Reflexes: Reflexes are normal and symmetric. Reflexes normal.  Psychiatric:        Behavior: Behavior normal.        Thought Content: Thought content normal.        Judgment: Judgment normal.           Assessment & Plan:   Encounter Diagnoses  Name Primary?   Closed nondisplaced fracture of second metatarsal bone of right foot, initial encounter Yes   Closed nondisplaced fracture of third metatarsal bone of right foot, initial encounter    Closed nondisplaced fracture of fourth metatarsal bone of right foot, initial encounter    I have explained the seriousness of the  injury.  There is no forefoot dislocation.  I have given sheet of instructions for Contrast Baths.  I will give pain medicine.  I have reviewed the [place] web site prior to prescribing narcotic medicine for this patient.  Return in two weeks.  Use the crutches and CAM walker.  Call if any problem.  Precautions discussed.  Electronically Signed Sanjuana Kava, MD 11/22/20238:43 AM

## 2022-09-15 ENCOUNTER — Other Ambulatory Visit (INDEPENDENT_AMBULATORY_CARE_PROVIDER_SITE_OTHER): Payer: Self-pay | Admitting: Family

## 2022-09-15 DIAGNOSIS — E109 Type 1 diabetes mellitus without complications: Secondary | ICD-10-CM

## 2022-09-27 ENCOUNTER — Ambulatory Visit: Payer: BC Managed Care – PPO | Admitting: Orthopaedic Surgery

## 2022-09-27 ENCOUNTER — Ambulatory Visit (INDEPENDENT_AMBULATORY_CARE_PROVIDER_SITE_OTHER): Payer: BC Managed Care – PPO

## 2022-09-27 ENCOUNTER — Encounter: Payer: Self-pay | Admitting: Orthopaedic Surgery

## 2022-09-27 VITALS — BP 103/76 | HR 96 | Ht 67.0 in | Wt 160.0 lb

## 2022-09-27 DIAGNOSIS — S92324D Nondisplaced fracture of second metatarsal bone, right foot, subsequent encounter for fracture with routine healing: Secondary | ICD-10-CM

## 2022-09-27 DIAGNOSIS — S92334D Nondisplaced fracture of third metatarsal bone, right foot, subsequent encounter for fracture with routine healing: Secondary | ICD-10-CM

## 2022-09-27 DIAGNOSIS — S92324A Nondisplaced fracture of second metatarsal bone, right foot, initial encounter for closed fracture: Secondary | ICD-10-CM | POA: Diagnosis not present

## 2022-09-27 DIAGNOSIS — S92344D Nondisplaced fracture of fourth metatarsal bone, right foot, subsequent encounter for fracture with routine healing: Secondary | ICD-10-CM

## 2022-09-27 NOTE — Progress Notes (Signed)
My foot is less swollen.  NV intact right foot, she still has swelling but less and resolving ecchymosis.  She is in a CAM walker and crutches.   X-rays were done of the right foot, reported separately.  Encounter Diagnoses  Name Primary?   Closed nondisplaced fracture of second metatarsal bone of right foot with routine healing    Closed nondisplaced fracture of third metatarsal bone of right foot with routine healing, subsequent encounter    Closed nondisplaced fracture of fourth metatarsal bone of right foot with routine healing, subsequent encounter Yes   I will refill pain medicine.  I have reviewed the [place] Controlled Substance Reporting System web site prior to prescribing narcotic medicine for this patient.  Return in two weeks.  X-rays then.  Continue contrast baths, CAM walker and crutches.  Call if any problem.  Precautions discussed.  Electronically Signed Darreld Mclean, MD 12/5/20233:36 PM

## 2022-10-11 ENCOUNTER — Encounter: Payer: Self-pay | Admitting: Orthopaedic Surgery

## 2022-10-11 ENCOUNTER — Ambulatory Visit (INDEPENDENT_AMBULATORY_CARE_PROVIDER_SITE_OTHER): Payer: BC Managed Care – PPO | Admitting: Orthopaedic Surgery

## 2022-10-11 ENCOUNTER — Ambulatory Visit (INDEPENDENT_AMBULATORY_CARE_PROVIDER_SITE_OTHER): Payer: BC Managed Care – PPO

## 2022-10-11 DIAGNOSIS — S92344D Nondisplaced fracture of fourth metatarsal bone, right foot, subsequent encounter for fracture with routine healing: Secondary | ICD-10-CM

## 2022-10-11 DIAGNOSIS — S92324D Nondisplaced fracture of second metatarsal bone, right foot, subsequent encounter for fracture with routine healing: Secondary | ICD-10-CM

## 2022-10-11 DIAGNOSIS — S92334D Nondisplaced fracture of third metatarsal bone, right foot, subsequent encounter for fracture with routine healing: Secondary | ICD-10-CM

## 2022-10-11 NOTE — Progress Notes (Signed)
I feel much better.  She has no pain.  She is wearing the CAM walker.  NV intact.  X-rays were done of the right foot, reported separately.  Encounter Diagnoses  Name Primary?   Closed nondisplaced fracture of second metatarsal bone of right foot with routine healing Yes   Closed nondisplaced fracture of third metatarsal bone of right foot with routine healing, subsequent encounter    Closed nondisplaced fracture of fourth metatarsal bone of right foot with routine healing, subsequent encounter    Return in two weeks.  Gradually come out of CAM walker.  X-rays on return.  Call if any problem.  Precautions discussed.  Electronically Signed Darreld Mclean, MD 12/19/20233:28 PM

## 2022-10-11 NOTE — Patient Instructions (Signed)
YOU CAN START TO COME OUT OF YOUR BOOT IN THE HOUSE. IF IT HURTS GO BACK IN THE BOOT. WEAR THE BOOT WHEN YOU ARE OUT AND ABOUT

## 2022-10-20 DIAGNOSIS — F332 Major depressive disorder, recurrent severe without psychotic features: Secondary | ICD-10-CM | POA: Diagnosis not present

## 2022-10-20 DIAGNOSIS — F1010 Alcohol abuse, uncomplicated: Secondary | ICD-10-CM | POA: Diagnosis not present

## 2022-10-25 ENCOUNTER — Encounter: Payer: BC Managed Care – PPO | Admitting: Orthopaedic Surgery

## 2022-10-27 DIAGNOSIS — F332 Major depressive disorder, recurrent severe without psychotic features: Secondary | ICD-10-CM | POA: Diagnosis not present

## 2022-10-27 DIAGNOSIS — F1010 Alcohol abuse, uncomplicated: Secondary | ICD-10-CM | POA: Diagnosis not present

## 2022-11-01 ENCOUNTER — Ambulatory Visit (INDEPENDENT_AMBULATORY_CARE_PROVIDER_SITE_OTHER): Payer: BC Managed Care – PPO | Admitting: Orthopaedic Surgery

## 2022-11-01 ENCOUNTER — Encounter: Payer: Self-pay | Admitting: Orthopaedic Surgery

## 2022-11-01 ENCOUNTER — Ambulatory Visit (INDEPENDENT_AMBULATORY_CARE_PROVIDER_SITE_OTHER): Payer: BC Managed Care – PPO

## 2022-11-01 DIAGNOSIS — S92344D Nondisplaced fracture of fourth metatarsal bone, right foot, subsequent encounter for fracture with routine healing: Secondary | ICD-10-CM

## 2022-11-01 DIAGNOSIS — S92324D Nondisplaced fracture of second metatarsal bone, right foot, subsequent encounter for fracture with routine healing: Secondary | ICD-10-CM

## 2022-11-01 DIAGNOSIS — S92334D Nondisplaced fracture of third metatarsal bone, right foot, subsequent encounter for fracture with routine healing: Secondary | ICD-10-CM

## 2022-11-01 NOTE — Patient Instructions (Signed)
NO MORE HOME MADE ZIP LINES!!!!!!  YOU MAY START TO COME OUT OF YOUR BOOT. YOU MAY HAVE SOME SWELLING THE FIRST DAY OR SO WHICH IS NORMAL.  ROV 2 WKS

## 2022-11-01 NOTE — Progress Notes (Signed)
I am OK  She has no pain of the right foot.  She has been wearing the CAM walker.  X-rays were done of the right foot, reported separately.  Encounter Diagnoses  Name Primary?   Closed nondisplaced fracture of second metatarsal bone of right foot with routine healing Yes   Closed nondisplaced fracture of third metatarsal bone of right foot with routine healing, subsequent encounter    Closed nondisplaced fracture of fourth metatarsal bone of right foot with routine healing, subsequent encounter    Come out of the CAM walker.  Return in two weeks for final X-rays.  Call if any problem.  Precautions discussed.  Electronically Signed Sanjuana Kava, MD 1/9/20243:33 PM

## 2022-11-14 ENCOUNTER — Ambulatory Visit (INDEPENDENT_AMBULATORY_CARE_PROVIDER_SITE_OTHER): Payer: BC Managed Care – PPO | Admitting: Family

## 2022-11-14 ENCOUNTER — Encounter (INDEPENDENT_AMBULATORY_CARE_PROVIDER_SITE_OTHER): Payer: Self-pay | Admitting: Family

## 2022-11-14 VITALS — BP 116/70 | HR 86 | Wt 170.2 lb

## 2022-11-14 DIAGNOSIS — Z9641 Presence of insulin pump (external) (internal): Secondary | ICD-10-CM

## 2022-11-14 DIAGNOSIS — E1065 Type 1 diabetes mellitus with hyperglycemia: Secondary | ICD-10-CM | POA: Diagnosis not present

## 2022-11-14 LAB — POCT GLUCOSE (DEVICE FOR HOME USE): POC Glucose: 165 mg/dl — AB (ref 70–99)

## 2022-11-14 LAB — POCT GLYCOSYLATED HEMOGLOBIN (HGB A1C): Hemoglobin A1C: 7.3 % — AB (ref 4.0–5.6)

## 2022-11-14 NOTE — Progress Notes (Signed)
Pediatric Endocrinology Diabetes Consultation Follow-up Visit  BRITTA LOUTH 06-07-2001 [id number]  Chief Complaint: Follow-up type 1 diabetes   Richmond Campbell., PA-C   HPI: Kiosha  is a 22 y.o. female presenting for follow-up of type 1 diabetes. she is accompanied to this visit by her mother.  1. Channell was diagnosed with type 1 diabetes on May 22, 2016. She had been complaining of polyuria/polydipsia/polyphagia with weight loss of about 16 pounds. She was seen by her PCP who determined that her blood sugar was 462 on POC and 513 on labs. They sent the family to the ER at [facility] where she was found to have type 1 diabetes. She was transitioned to [facility] for admission. She was admitted to the pediatric ward for introduction of insulin and diabetes education. She was started on MDI with Lantus and Novolog. She was transitioned to Humalog based on insurance coverage. She had a hemoglobin a1c of 12.6%. 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  2. Since last visit to [facility] on 07/2022  she has been well.    Omnipod 5 and Dexcom CGm are working well. Rarely has failed pods or Dexcom sensors. Reports that "I am trying to get better at bolusing". She gets busy at lunch time and that is when she most frequently forgets to bolus. She estimates eating 25-40 grams of carbs per meal. Hypoglycemia is rare, she is able to feel symptoms when she is low.   Insulin regimen:  (when using injections) 32 units of Lantus,  Humalog 100/20/3 plan \ Omnipod 5  Basal (Max: 3.0 units/hr) 12AM 1.40  6AM 1.55  11PM 1.40                 Total: 36.15 units   Insulin to carbohydrate ratio (ICR)  12AM 3  12PM 3  4:30PM 3                 Max Bolus: 30 units   Insulin Sensitivity Factor (ISF) 12AM 20                               Target BG 12AM 110                               Hypoglycemia: Able to feel low blood sugars.  No glucagon needed recently.  Feels  sweaty and shaky.  Blood glucose download:   Med-alert IaD: Not currently wearing. Injection sites: Arms, abdomen and legs  Annual labs due: 05/2022--> ordered  Ophthalmology due: 12/2022. Discussed importance today.     3. ROS: Greater than 10 systems reviewed with pertinent positives listed in HPI, otherwise neg. Constitutional: Sleeping well. Weight stable.  Eyes: No changes in vision. No blurry vision.  Ears/Nose/Mouth/Throat: No difficulty swallowing. No neck swelling  Cardiovascular: Denies palpitation. No chest pain  Respiratory: No increased work of breathing. No SOB Gastrointestinal: No constipation . Denies abdominal pain.  Genitourinary: No nocturia, no polyuria Neurologic: Normal sensation, no tremor Endocrine: No polydipsia.  No hyperpigmentation Psychiatric: Normal affect. Denies anxiety and depression.   Past Medical History:   Past Medical History:  Diagnosis Date   Diabetes mellitus without complication (HCC)     Medications:  Outpatient Encounter Medications as of 11/14/2022  Medication Sig Note   albuterol (PROVENTIL HFA;VENTOLIN HFA) 108 (90 Base) MCG/ACT inhaler Inhale 2 puffs into the  lungs every 6 (six) hours as needed for wheezing or shortness of breath.    B-D UF III MINI PEN NEEDLES 31G X 5 MM MISC USE AS DIRECTED TO INJECT INSULIN 6 TIMES DAILY    Blood Glucose Monitoring Suppl (ONETOUCH VERIO IQ SYSTEM) w/Device KIT Use to check blood glucose    Continuous Blood Gluc Receiver (DEXCOM G6 RECEIVER) DEVI 1 Device by Does not apply route as directed.    Continuous Blood Gluc Sensor (DEXCOM G6 SENSOR) MISC Change sensor every 10 days    Continuous Blood Gluc Transmit (DEXCOM G6 TRANSMITTER) MISC USE AS DIRECTED FOR 90 DAYS    Insulin Disposable Pump (OMNIPOD 5 G6 POD, GEN 5,) MISC INJECT 1 DEVICE INTO THE SKIN AS DIRECTED. CHANGE POD EVERY 2 DAYS. PATIENT WILL NEED 3 BOXES (EACH CONTAIN 5 PODS) FOR A 30 DAY SUPPLY. [employer]aker [id number].     insulin lispro (HUMALOG) 100 UNIT/ML injection INJECT UP TO 200 UNITS INTO INSULIN PUMP EVERY 2-3 DAYS PER PROVIDER GUIDANCE.    Glucagon (BAQSIMI TWO PACK) 3 MG/DOSE POWD Place 3 mg into the nose as needed. (Patient not taking: Reported on 11/14/2022) 12/13/2021: PRN emergencies   HYDROcodone-acetaminophen (NORCO/VICODIN) 5-325 MG tablet One tablet every four hours as needed for acute pain.  Limit of five days per [place] statue. (Patient not taking: Reported on 11/14/2022)    insulin lispro (HUMALOG KWIKPEN) 100 UNIT/ML KwikPen INJECT UP TO 50 UNITS PER DAY (Patient not taking: Reported on 11/14/2022)    ONETOUCH VERIO test strip CHECK BLOOD SUGAR 6X DAY (90 DAY SUPPLY) (Patient not taking: Reported on 11/14/2022)    No facility-administered encounter medications on file as of 11/14/2022.    Allergies: No Known Allergies  Surgical History: No past surgical history on file.  Family History:  No family history on file.    Social History: Lives with: Mother and Father  Graduated. Working as Consulting civil engineer.   Physical Exam:  Vitals:   11/14/22 1128  BP: 116/70  Pulse: 86  Weight: 170 lb 3.2 oz (77.2 kg)        BP 116/70   Pulse 86   Wt 170 lb 3.2 oz (77.2 kg)   BMI 26.66 kg/m  Body mass index: body mass index is 26.66 kg/m. Growth %ile SmartLinks can only be used for patients less than 17 years old.  Ht Readings from Last 3 Encounters:  09/27/22 5\' 7"  (1.702 m)  09/14/22 5' 7.5" (1.715 m)  01/14/21 5' 7.52" (1.715 m) (90 %, Z= 1.29)*   * Growth percentiles are based on CDC (Girls, 2-20 Years) data.   Wt Readings from Last 3 Encounters:  11/14/22 170 lb 3.2 oz (77.2 kg)  09/27/22 160 lb (72.6 kg)  09/14/22 160 lb (72.6 kg)    PHYSICAL EXAM:  General: Well developed, well nourished female in no acute distress.   Head: Normocephalic, atraumatic.   Eyes:  Pupils equal and round. EOMI.   Sclera white.  No eye drainage.   Ears/Nose/Mouth/Throat: Nares patent, no  nasal drainage.  Normal dentition, mucous membranes moist.   Neck: supple, no cervical lymphadenopathy, no thyromegaly Cardiovascular: regular rate, normal S1/S2, no murmurs Respiratory: No increased work of breathing.  Lungs clear to auscultation bilaterally.  No wheezes. Abdomen: soft, nontender, nondistended. No appreciable masses  Extremities: warm, well perfused, cap refill < 2 sec.   Musculoskeletal: Normal muscle mass.  Normal strength Skin: warm, dry.  No rash or lesions. Neurologic: alert and oriented, normal  speech, no tremor   Labs: Last hemoglobin A1c:7.2 % on 07/2022 Lab Results  Component Value Date   HGBA1C 7.3 (A) 11/14/2022   Results for orders placed or performed in visit on 11/14/22  POCT glycosylated hemoglobin (Hb A1C)  Result Value Ref Range   Hemoglobin A1C 7.3 (A) 4.0 - 5.6 %   HbA1c POC (<> result, manual entry)     HbA1c, POC (prediabetic range)     HbA1c, POC (controlled diabetic range)    POCT Glucose (Device for Home Use)  Result Value Ref Range   Glucose Fasting, POC     POC Glucose 165 (A) 70 - 99 mg/dl    Assessment/Plan: Denetra is a 22 y.o. female with  type 1 diabetes on Omnipod insulin pump therapy. Hemoglobin A1c is 7.3% today. Her time in target range has increased to 65%.    1. DM w/o complication type I, uncontrolled (HCC)/ 2. Hyperglycemia  - Reviewed insulin pump and CGM download. Discussed trends and patterns.  - Rotate pump sites to prevent scar tissue.  - bolus 15 minutes prior to eating to limit blood sugar spikes.  - Reviewed carb counting and importance of accurate carb counting.  - Discussed signs and symptoms of hypoglycemia. Always have glucose available.  - POCT glucose and hemoglobin A1c  - Reviewed growth chart.  - Encouraged to bolus before eating. Review carb counting.  - Advised that Omnipod will be releasing a new App for her phone soon.  - Lipid panel, TFTS, microalbumin and CMP ordered  - Reminded to have annual  eye exam.   3. Insulin pump titration/pump in place -Pump in place   Follow-up:   3 month.   LOS: >40  spent today reviewing the medical chart, counseling the patient/family, and documenting today's visit.    When a patient is on insulin, intensive monitoring of blood glucose levels is necessary to avoid hyperglycemia and hypoglycemia. Severe hyperglycemia/hypoglycemia can lead to hospital admissions and be life threatening.     Hermenia Bers,  FNP-C  Pediatric Specialist  [address] [place]  [place], [address]  Tele: [phone]

## 2022-11-14 NOTE — Patient Instructions (Addendum)
It was a pleasure seeing you in clinic today. Please do not hesitate to contact me if you have questions or concerns.   Please sign up for MyChart. This is a communication tool that allows you to send an email directly to me. This can be used for questions, prescriptions and blood sugar reports. We will also release labs to you with instructions on MyChart. Please do not use MyChart if you need immediate or emergency assistance. Ask our wonderful front office staff if you need assistance.   - labs today.  - A1c 7.3%.

## 2022-11-15 DIAGNOSIS — F332 Major depressive disorder, recurrent severe without psychotic features: Secondary | ICD-10-CM | POA: Diagnosis not present

## 2022-11-15 DIAGNOSIS — F1010 Alcohol abuse, uncomplicated: Secondary | ICD-10-CM | POA: Diagnosis not present

## 2022-11-15 LAB — COMPLETE METABOLIC PANEL WITH GFR
AG Ratio: 1.8 (calc) (ref 1.0–2.5)
ALT: 6 U/L (ref 6–29)
AST: 11 U/L (ref 10–30)
Albumin: 4.5 g/dL (ref 3.6–5.1)
Alkaline phosphatase (APISO): 80 U/L (ref 31–125)
BUN: 9 mg/dL (ref 7–25)
CO2: 25 mmol/L (ref 20–32)
Calcium: 9.5 mg/dL (ref 8.6–10.2)
Chloride: 104 mmol/L (ref 98–110)
Creat: 0.8 mg/dL (ref 0.50–0.96)
Globulin: 2.5 g/dL (calc) (ref 1.9–3.7)
Glucose, Bld: 135 mg/dL (ref 65–139)
Potassium: 4.2 mmol/L (ref 3.5–5.3)
Sodium: 139 mmol/L (ref 135–146)
Total Bilirubin: 1 mg/dL (ref 0.2–1.2)
Total Protein: 7 g/dL (ref 6.1–8.1)
eGFR: 108 mL/min/{1.73_m2} (ref >=60)

## 2022-11-15 LAB — LIPID PANEL
Cholesterol: 140 mg/dL (ref <200)
HDL: 61 mg/dL (ref >=50)
LDL Cholesterol (Calc): 62 mg/dL (calc)
Non-HDL Cholesterol (Calc): 79 mg/dL (calc) (ref <130)
Total CHOL/HDL Ratio: 2.3 (calc) (ref <5.0)
Triglycerides: 86 mg/dL (ref <150)

## 2022-11-15 LAB — TSH: TSH: 1 mIU/L

## 2022-11-15 LAB — T4, FREE: Free T4: 1 ng/dL (ref 0.8–1.4)

## 2022-11-24 ENCOUNTER — Encounter (INDEPENDENT_AMBULATORY_CARE_PROVIDER_SITE_OTHER): Payer: Self-pay

## 2022-11-24 DIAGNOSIS — E109 Type 1 diabetes mellitus without complications: Secondary | ICD-10-CM

## 2022-11-24 MED ORDER — DEXCOM G6 SENSOR MISC: 1 refills | Status: DC

## 2022-11-28 MED ORDER — DEXCOM G6 SENSOR MISC: 1 refills | Status: DC

## 2022-11-28 NOTE — Addendum Note (Signed)
Addended by: Mike Gip A on: 11/28/2022 02:09 PM   Modules accepted: Orders

## 2022-11-29 ENCOUNTER — Ambulatory Visit: Payer: BC Managed Care – PPO | Admitting: Orthopaedic Surgery

## 2022-12-22 ENCOUNTER — Encounter: Payer: Self-pay | Admitting: Radiology

## 2022-12-26 ENCOUNTER — Other Ambulatory Visit (INDEPENDENT_AMBULATORY_CARE_PROVIDER_SITE_OTHER): Payer: Self-pay | Admitting: Family

## 2022-12-26 DIAGNOSIS — E109 Type 1 diabetes mellitus without complications: Secondary | ICD-10-CM

## 2023-01-06 ENCOUNTER — Other Ambulatory Visit (INDEPENDENT_AMBULATORY_CARE_PROVIDER_SITE_OTHER): Payer: Self-pay | Admitting: Family

## 2023-01-06 DIAGNOSIS — E1065 Type 1 diabetes mellitus with hyperglycemia: Secondary | ICD-10-CM

## 2023-02-13 ENCOUNTER — Ambulatory Visit (INDEPENDENT_AMBULATORY_CARE_PROVIDER_SITE_OTHER): Payer: Self-pay | Admitting: Family

## 2023-03-09 ENCOUNTER — Encounter (INDEPENDENT_AMBULATORY_CARE_PROVIDER_SITE_OTHER): Payer: Self-pay

## 2023-03-09 DIAGNOSIS — E109 Type 1 diabetes mellitus without complications: Secondary | ICD-10-CM

## 2023-03-09 MED ORDER — DEXCOM G6 TRANSMITTER MISC: 1 refills | Status: DC

## 2023-03-13 ENCOUNTER — Encounter (INDEPENDENT_AMBULATORY_CARE_PROVIDER_SITE_OTHER): Payer: Self-pay | Admitting: Family

## 2023-03-13 ENCOUNTER — Ambulatory Visit (INDEPENDENT_AMBULATORY_CARE_PROVIDER_SITE_OTHER): Payer: BC Managed Care – PPO | Admitting: Family

## 2023-03-13 VITALS — BP 114/68 | HR 70 | Wt 162.0 lb

## 2023-03-13 DIAGNOSIS — R569 Unspecified convulsions: Secondary | ICD-10-CM | POA: Insufficient documentation

## 2023-03-13 DIAGNOSIS — E1065 Type 1 diabetes mellitus with hyperglycemia: Secondary | ICD-10-CM

## 2023-03-13 DIAGNOSIS — Z9641 Presence of insulin pump (external) (internal): Secondary | ICD-10-CM

## 2023-03-13 DIAGNOSIS — E109 Type 1 diabetes mellitus without complications: Secondary | ICD-10-CM

## 2023-03-13 LAB — POCT GLYCOSYLATED HEMOGLOBIN (HGB A1C): Hemoglobin A1C: 6.5 % — AB (ref 4.0–5.6)

## 2023-03-13 LAB — POCT GLUCOSE (DEVICE FOR HOME USE): POC Glucose: 125 mg/dl — AB (ref 70–99)

## 2023-03-13 NOTE — Patient Instructions (Signed)
It was a pleasure seeing you in clinic today. Please do not hesitate to contact me if you have questions or concerns.  ° °Please sign up for MyChart. This is a communication tool that allows you to send an email directly to me. This can be used for questions, prescriptions and blood sugar reports. We will also release labs to you with instructions on MyChart. Please do not use MyChart if you need immediate or emergency assistance. Ask our wonderful front office staff if you need assistance.  ° °

## 2023-03-13 NOTE — Progress Notes (Signed)
Pediatric Endocrinology Diabetes Consultation Follow-up Visit  Leslie Mccarthy 2001/10/02 [id number]  Chief Complaint: Follow-up type 1 diabetes   Richmond Campbell., PA-C   HPI: Leslie Mccarthy  is a 22 y.o. female presenting for follow-up of type 1 diabetes. she is accompanied to this visit by her mother.  1. Leslie Mccarthy was diagnosed with type 1 diabetes on May 22, 2016. She had been complaining of polyuria/polydipsia/polyphagia with weight loss of about 16 pounds. She was seen by her PCP who determined that her blood sugar was 462 on POC and 513 on labs. They sent the family to the ER at [facility] where she was found to have type 1 diabetes. She was transitioned to [facility] for admission. She was admitted to the pediatric ward for introduction of insulin and diabetes education. She was started on MDI with Lantus and Novolog. She was transitioned to Humalog based on insurance coverage. She had a hemoglobin a1c of 12.6%. 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  2. Since last visit to [facility] on 10/2022   she has been well.    She is teaching high school math. Goes for walk for activity.   Diabetes care has been going well recently. She uses Omnipod 5 insulin pump and Dexcom G6 CGM. Both have been working well, rarely has pump site failures. She mainly uses legs and arms, gets irritation abdomen. She is bolusing before eating, rarely forgets to bolus. Estimates around 30 grams of carbs per meal. Low blood sugars do not occur often, she is able feels symptoms when low. Low glucagon needed.    Insulin regimen:  (when using injections) 32 units of Lantus,  Humalog 100/20/3 plan \ Omnipod 5  Basal (Max: 3.0 units/hr) 12AM 1.40  6AM 1.55  11PM 1.40                 Total: 36.15 units   Insulin to carbohydrate ratio (ICR)  12AM 3  12PM 3  4:30PM 3                 Max Bolus: 30 units   Insulin Sensitivity Factor (ISF) 12AM 20                                Target BG 12AM 110                               Hypoglycemia: Able to feel low blood sugars.  No glucagon needed recently.  Feels sweaty and shaky.  Blood glucose download:   Med-alert IaD: Not currently wearing. Injection sites: Arms, abdomen and legs  Annual labs due: 11/2023 Ophthalmology due: 12/2022.(Has a summer appointment)  Discussed importance today.     3. ROS: Greater than 10 systems reviewed with pertinent positives listed in HPI, otherwise neg. Constitutional: Sleeping well. 8 lbs weight loss.   Eyes: No changes in vision. No blurry vision.  Ears/Nose/Mouth/Throat: No difficulty swallowing. No neck swelling  Cardiovascular: Denies palpitation. No chest pain  Respiratory: No increased work of breathing. No SOB Gastrointestinal: No constipation . Denies abdominal pain.  Genitourinary: No nocturia, no polyuria Neurologic: Normal sensation, no tremor Endocrine: No polydipsia.  No hyperpigmentation Psychiatric: Normal affect. Denies anxiety and depression.   Past Medical History:   Past Medical History:  Diagnosis Date   Diabetes mellitus without complication (HCC)     Medications:  Outpatient Encounter Medications as of 03/13/2023  Medication Sig Note   albuterol (PROVENTIL HFA;VENTOLIN HFA) 108 (90 Base) MCG/ACT inhaler Inhale 2 puffs into the lungs every 6 (six) hours as needed for wheezing or shortness of breath.    B-D UF III MINI PEN NEEDLES 31G X 5 MM MISC USE AS DIRECTED TO INJECT INSULIN 6 TIMES DAILY    Blood Glucose Monitoring Suppl (ONETOUCH VERIO IQ SYSTEM) w/Device KIT Use to check blood glucose    Continuous Blood Gluc Receiver (DEXCOM G6 RECEIVER) DEVI 1 Device by Does not apply route as directed.    Continuous Blood Gluc Sensor (DEXCOM G6 SENSOR) MISC Change sensor every 10 days    Continuous Glucose Transmitter (DEXCOM G6 TRANSMITTER) MISC USE AS DIRECTED FOR 90 DAYS    Glucagon (BAQSIMI TWO PACK) 3 MG/DOSE POWD Place 3 mg into the nose as  needed. 12/13/2021: PRN emergencies   Insulin Disposable Pump (OMNIPOD 5 G6 PODS, GEN 5,) MISC INJECT 1 DEVICE INTO THE SKIN AS DIRECTED. CHANGE POD EVERY 2 DAYS. PATIENT WILL NEED 3 BOXES (EACH CONTAIN 5 PODS) FOR A 30 DAY SUPPLY. PLEASE FILL FOR NDC [id number].    insulin lispro (HUMALOG KWIKPEN) 100 UNIT/ML KwikPen INJECT UP TO 50 UNITS PER DAY    insulin lispro (HUMALOG) 100 UNIT/ML injection INJECT UP TO 200 UNITS INTO INSULIN PUMP EVERY 2-3 DAYS PER PROVIDER GUIDANCE.    HYDROcodone-acetaminophen (NORCO/VICODIN) 5-325 MG tablet One tablet every four hours as needed for acute pain.  Limit of five days per [place] statue. (Patient not taking: Reported on 11/14/2022)    ONETOUCH VERIO test strip CHECK BLOOD SUGAR 6X DAY (90 DAY SUPPLY) (Patient not taking: Reported on 11/14/2022)    No facility-administered encounter medications on file as of 03/13/2023.    Allergies: No Known Allergies  Surgical History: No past surgical history on file.  Family History:  No family history on file.    Social History: Lives with: Mother and Father  Graduated. Working as Geophysicist/field seismologist.   Physical Exam:  Vitals:   03/13/23 1437  BP: 114/68  Pulse: 70  Weight: 162 lb (73.5 kg)     BP 114/68   Pulse 70   Wt 162 lb (73.5 kg)   BMI 25.37 kg/m  Body mass index: body mass index is 25.37 kg/m. Growth %ile SmartLinks can only be used for patients less than 10 years old.  Ht Readings from Last 3 Encounters:  09/27/22 5\' 7"  (1.702 m)  09/14/22 5' 7.5" (1.715 m)  01/14/21 5' 7.52" (1.715 m) (90 %, Z= 1.29)*   * Growth percentiles are based on CDC (Girls, 2-20 Years) data.   Wt Readings from Last 3 Encounters:  03/13/23 162 lb (73.5 kg)  11/14/22 170 lb 3.2 oz (77.2 kg)  09/27/22 160 lb (72.6 kg)    PHYSICAL EXAM:  General: Well developed, well nourished female in no acute distress.   Head: Normocephalic, atraumatic.   Eyes:  Pupils equal and round. EOMI.   Sclera white.  No eye  drainage.   Ears/Nose/Mouth/Throat: Nares patent, no nasal drainage.  Normal dentition, mucous membranes moist.   Neck: supple, no cervical lymphadenopathy, no thyromegaly Cardiovascular: regular rate, normal S1/S2, no murmurs Respiratory: No increased work of breathing.  Lungs clear to auscultation bilaterally.  No wheezes. Abdomen: soft, nontender, nondistended. No appreciable masses  Extremities: warm, well perfused, cap refill < 2 sec.   Musculoskeletal: Normal muscle mass.  Normal strength Skin: warm, dry.  No rash or  lesions. Neurologic: alert and oriented, normal speech, no tremor   Labs: Last hemoglobin A1c:7.3 % on 10/2022 Lab Results  Component Value Date   HGBA1C 6.5 (A) 03/13/2023   Results for orders placed or performed in visit on 03/13/23  POCT glycosylated hemoglobin (Hb A1C)  Result Value Ref Range   Hemoglobin A1C 6.5 (A) 4.0 - 5.6 %   HbA1c POC (<> result, manual entry)     HbA1c, POC (prediabetic range)     HbA1c, POC (controlled diabetic range)    POCT Glucose (Device for Home Use)  Result Value Ref Range   Glucose Fasting, POC     POC Glucose 125 (A) 70 - 99 mg/dl    Assessment/Plan: Kynslie is a 22 y.o. female with  type 1 diabetes on Omnipod insulin pump therapy. She is bolusing more consistently. Her time in target range is 62% and hemoglobin A1c has improved to 6.5% today which meets ADA goal of <7%.    1. DM w/o complication type I, uncontrolled (HCC)/ 2. Hyperglycemia  - Reviewed insulin pump and CGM download. Discussed trends and patterns.  - Rotate pump sites to prevent scar tissue.  - bolus 15 minutes prior to eating to limit blood sugar spikes.  - Reviewed carb counting and importance of accurate carb counting.  - Discussed signs and symptoms of hypoglycemia. Always have glucose available.  - POCT glucose and hemoglobin A1c  - Reviewed growth chart.  - Discussed updates including Omnipod 5 iphone app and Dexcom G7  - Encouraged annual eye  exam and stressed importance.   3. Insulin pump titration/pump in place -No changes to pump settings today.    Follow-up:   3 month.   LOS: >40  spent today reviewing the medical chart, counseling the patient/family, and documenting today's visit.  When a patient is on insulin, intensive monitoring of blood glucose levels is necessary to avoid hyperglycemia and hypoglycemia. Severe hyperglycemia/hypoglycemia can lead to hospital admissions and be life threatening.     Gretchen Short,  FNP-C  Pediatric Specialist  [address]  [place], [address]  Tele: [phone]

## 2023-03-22 ENCOUNTER — Other Ambulatory Visit (INDEPENDENT_AMBULATORY_CARE_PROVIDER_SITE_OTHER): Payer: Self-pay | Admitting: Family

## 2023-03-22 DIAGNOSIS — E109 Type 1 diabetes mellitus without complications: Secondary | ICD-10-CM

## 2023-04-28 ENCOUNTER — Encounter (INDEPENDENT_AMBULATORY_CARE_PROVIDER_SITE_OTHER): Payer: Self-pay

## 2023-05-07 ENCOUNTER — Ambulatory Visit
Admission: EM | Admit: 2023-05-07 | Discharge: 2023-05-07 | Disposition: A | Discharge status: Home / Self Care | Payer: BC Managed Care – PPO | Attending: Family Medicine | Admitting: Family Medicine

## 2023-05-07 DIAGNOSIS — H9193 Unspecified hearing loss, bilateral: Secondary | ICD-10-CM | POA: Diagnosis not present

## 2023-05-07 DIAGNOSIS — H6123 Impacted cerumen, bilateral: Secondary | ICD-10-CM | POA: Diagnosis not present

## 2023-05-07 MED ORDER — FLUTICASONE PROPIONATE 50 MCG/ACT NA SUSP: 1.0000 | Freq: Two times a day (BID) | NASAL | 2 refills | Status: DC

## 2023-05-07 MED ORDER — PSEUDOEPHEDRINE HCL 60 MG PO TABS: 60.0000 mg | ORAL_TABLET | Freq: Three times a day (TID) | ORAL | 0 refills | Status: DC | PRN

## 2023-05-07 MED ORDER — CARBAMIDE PEROXIDE 6.5 % OT SOLN: 5.0000 [drp] | Freq: Two times a day (BID) | OTIC | 0 refills | Status: DC

## 2023-05-07 NOTE — ED Provider Notes (Signed)
[facility]    CSN: [id number] Arrival date & time: 05/07/23  1355      History   Chief Complaint No chief complaint on file.   HPI Leslie Mccarthy is a 22 y.o. female.   Presenting today with 2-week history of muffled hearing, ear fullness and some mild left ear pain.  Denies drainage, bleeding, fever, chills, congestion, sharp stabbing pains.  Tried an over-the-counter earwax removal kit with some mild benefit to the right side but still symptomatic.    Past Medical History:  Diagnosis Date   Diabetes mellitus without complication [facility])     Patient Active Problem List   Diagnosis Date Noted   Seizures (HCC) 03/13/2023   Hypoglycemia due to type 1 diabetes mellitus (HCC) 08/20/2019   Amenorrhea, secondary 12/18/2018   Constipation in pediatric patient 12/18/2018   Type 1 diabetes mellitus with other specified complication (HCC) 01/05/2017   Hyperglycemia 05/22/2016    History reviewed. No pertinent surgical history.  OB History   No obstetric history on file.      Home Medications    Prior to Admission medications   Medication Sig Start Date End Date Taking? Authorizing Provider  carbamide peroxide (DEBROX) 6.5 % OTIC solution Place 5 drops into both ears 2 (two) times daily. 05/07/23  Yes Particia Nearing, PA-C  fluticasone [facility]) 50 MCG/ACT nasal spray Place 1 spray into both nostrils 2 (two) times daily. 05/07/23  Yes Particia Nearing, PA-C  pseudoephedrine (SUDAFED) 60 MG tablet Take 1 tablet (60 mg total) by mouth every 8 (eight) hours as needed for congestion. 05/07/23  Yes Particia Nearing, PA-C  albuterol (PROVENTIL HFA;VENTOLIN HFA) 108 (90 Base) MCG/ACT inhaler Inhale 2 puffs into the lungs every 6 (six) hours as needed for wheezing or shortness of breath.    [provider]  B-D UF III MINI PEN NEEDLES 31G X 5 MM MISC USE AS DIRECTED TO INJECT INSULIN 6 TIMES DAILY 08/17/22   Gretchen Short, NP  Blood Glucose  Monitoring Suppl (ONETOUCH VERIO IQ SYSTEM) w/Device KIT Use to check blood glucose 06/30/16   Dessa Phi, MD  Continuous Blood Gluc Receiver (DEXCOM G6 RECEIVER) DEVI 1 Device by Does not apply route as directed. 06/01/20   Gretchen Short, NP  Continuous Blood Gluc Sensor (DEXCOM G6 SENSOR) MISC Change sensor every 10 days 11/28/22   Gretchen Short, NP  Continuous Glucose Transmitter (DEXCOM G6 TRANSMITTER) MISC USE AS DIRECTED FOR 90 DAYS 03/09/23   Gretchen Short, NP  Glucagon (BAQSIMI TWO PACK) 3 MG/DOSE POWD Place 3 mg into the nose as needed. 06/02/21   Gretchen Short, NP  HYDROcodone-acetaminophen (NORCO/VICODIN) 5-325 MG tablet One tablet every four hours as needed for acute pain.  Limit of five days per [place] statue. Patient not taking: Reported on 11/14/2022 09/14/22   Darreld Mclean, MD  Insulin Disposable Pump (OMNIPOD 5 G6 PODS, GEN 5,) MISC INJECT 1 DEVICE INTO THE SKIN AS DIRECTED. CHANGE POD EVERY 2 DAYS. PATIENT WILL NEED 3 BOXES (EACH CONTAIN 5 PODS) FOR A 30 DAY SUPPLY. PLEASE FILL FOR [facility] [id number]. 12/26/22   Gretchen Short, NP  insulin lispro (HUMALOG KWIKPEN) 100 UNIT/ML KwikPen INJECT UP TO 50 UNITS PER DAY 01/06/23   Gretchen Short, NP  insulin lispro (HUMALOG) 100 UNIT/ML injection INJECT UP TO 200 UNITS INTO INSULIN PUMP EVERY 2-3 DAYS PER PROVIDER GUIDANCE. 03/22/23   Gretchen Short, NP  ONETOUCH VERIO test strip CHECK BLOOD SUGAR 6X DAY (90 DAY SUPPLY) Patient  not taking: Reported on 11/14/2022 01/20/20   Gretchen Short, NP    Family History History reviewed. No pertinent family history.  Social History Social History   Tobacco Use   Smoking status: Never   Smokeless tobacco: Never  Substance Use Topics   Alcohol use: No   Drug use: No     Allergies   Patient has no known allergies.   Review of Systems Review of Systems Per HPI  Physical Exam Triage Vital Signs ED Triage Vitals [05/07/23 1430]  Encounter Vitals Group     BP 111/74      Systolic BP Percentile      Diastolic BP Percentile      Pulse Rate 83     Resp 20     Temp 98.3 F (36.8 C)     Temp Source Oral     SpO2 98 %     Weight      Height      Head Circumference      Peak Flow      Pain Score 5     Pain Loc      Pain Education      Exclude from Growth Chart    No data found.  Updated Vital Signs BP 111/74 (BP Location: Right Arm)   Pulse 83   Temp 98.3 F (36.8 C) (Oral)   Resp 20   LMP 04/25/2023   SpO2 98%   Visual Acuity Right Eye Distance:   Left Eye Distance:   Bilateral Distance:    Right Eye Near:   Left Eye Near:    Bilateral Near:     Physical Exam Vitals and nursing note reviewed.  Constitutional:      Appearance: Normal appearance. She is not ill-appearing.  HENT:     Head: Atraumatic.     Right Ear: There is impacted cerumen.     Left Ear: There is impacted cerumen.  Eyes:     Extraocular Movements: Extraocular movements intact.     Conjunctiva/sclera: Conjunctivae normal.  Cardiovascular:     Rate and Rhythm: Normal rate and regular rhythm.     Heart sounds: Normal heart sounds.  Pulmonary:     Effort: Pulmonary effort is normal.     Breath sounds: Normal breath sounds.  Musculoskeletal:        General: Normal range of motion.     Cervical back: Normal range of motion and neck supple.  Skin:    General: Skin is warm and dry.  Neurological:     Mental Status: She is alert and oriented to person, place, and time.  Psychiatric:        Mood and Affect: Mood normal.        Thought Content: Thought content normal.        Judgment: Judgment normal.      UC Treatments / Results  Labs (all labs ordered are listed, but only abnormal results are displayed) Labs Reviewed - No data to display  EKG   Radiology No results found.  Procedures Procedures (including critical care time)  Medications Ordered in UC Medications - No data to display  Initial Impression / Assessment and Plan / UC Course  I have  reviewed the triage vital signs and the nursing notes.  Pertinent labs & imaging results that were available during my care of the patient were reviewed by me and considered in my medical decision making (see chart for details).     Unable to fully remove  wax plug from the left ear, but wax was successfully removed from the right ear and TM visualized and benign.  Treat with Debrox drops, Flonase and Sudafed in case issue is more internal at this point with eustachian tube dysfunction.  Return for any worsening symptoms.  Final Clinical Impressions(s) / UC Diagnoses   Final diagnoses:  Bilateral change in hearing  Bilateral impacted cerumen     Discharge Instructions      Use the Debrox drops several times daily and rinse with warm water to remove wax debris.  I have also sent Flonase and Sudafed to see if some of the muffled feeling is coming from some eustachian tube dysfunction which builds up fluid behind the eardrum internally.  Follow-up with your primary care provider if not fully resolving.     ED Prescriptions     Medication Sig Dispense Auth. Provider   carbamide peroxide (DEBROX) 6.5 % OTIC solution Place 5 drops into both ears 2 (two) times daily. 15 mL Particia Nearing, PA-C   fluticasone [facility]) 50 MCG/ACT nasal spray Place 1 spray into both nostrils 2 (two) times daily. 16 g Particia Nearing, [place]   pseudoephedrine (SUDAFED) 60 MG tablet Take 1 tablet (60 mg total) by mouth every 8 (eight) hours as needed for congestion. 15 tablet Particia Nearing, [place]      PDMP not reviewed this encounter.   Particia Nearing, [place] 05/07/23 1534

## 2023-05-07 NOTE — ED Triage Notes (Signed)
Pt reports bilateral ear clogging x 2 weeks ago. Pt states her left is more painful.

## 2023-05-07 NOTE — Discharge Instructions (Signed)
Use the Debrox drops several times daily and rinse with warm water to remove wax debris.  I have also sent Flonase and Sudafed to see if some of the muffled feeling is coming from some eustachian tube dysfunction which builds up fluid behind the eardrum internally.  Follow-up with your primary care provider if not fully resolving.

## 2023-05-11 ENCOUNTER — Encounter (INDEPENDENT_AMBULATORY_CARE_PROVIDER_SITE_OTHER): Payer: Self-pay

## 2023-05-17 ENCOUNTER — Other Ambulatory Visit (INDEPENDENT_AMBULATORY_CARE_PROVIDER_SITE_OTHER): Payer: Self-pay | Admitting: Family

## 2023-05-17 DIAGNOSIS — E109 Type 1 diabetes mellitus without complications: Secondary | ICD-10-CM

## 2023-06-13 ENCOUNTER — Encounter (INDEPENDENT_AMBULATORY_CARE_PROVIDER_SITE_OTHER): Payer: Self-pay | Admitting: Family

## 2023-06-13 ENCOUNTER — Ambulatory Visit (INDEPENDENT_AMBULATORY_CARE_PROVIDER_SITE_OTHER): Payer: BC Managed Care – PPO | Admitting: Family

## 2023-06-13 VITALS — BP 110/70 | HR 80 | Wt 164.6 lb

## 2023-06-13 DIAGNOSIS — E1065 Type 1 diabetes mellitus with hyperglycemia: Secondary | ICD-10-CM

## 2023-06-13 DIAGNOSIS — Z9641 Presence of insulin pump (external) (internal): Secondary | ICD-10-CM | POA: Diagnosis not present

## 2023-06-13 LAB — POCT GLYCOSYLATED HEMOGLOBIN (HGB A1C): Hemoglobin A1C: 6.9 % — AB (ref 4.0–5.6)

## 2023-06-13 LAB — POCT GLUCOSE (DEVICE FOR HOME USE): POC Glucose: 117 mg/dl — AB (ref 70–99)

## 2023-06-13 NOTE — Patient Instructions (Signed)
It was a pleasure seeing you in clinic today. Please do not hesitate to contact me if you have questions or concerns.  ° °Please sign up for MyChart. This is a communication tool that allows you to send an email directly to me. This can be used for questions, prescriptions and blood sugar reports. We will also release labs to you with instructions on MyChart. Please do not use MyChart if you need immediate or emergency assistance. Ask our wonderful front office staff if you need assistance.  ° °

## 2023-06-13 NOTE — Progress Notes (Signed)
Pediatric Endocrinology Diabetes Consultation Follow-up Visit  Leslie Mccarthy 2001-07-17 [id number]  Chief Complaint: Follow-up type 1 diabetes   Leslie Mccarthy., PA-C   HPI: Leslie Mccarthy  is a 22 y.o. female presenting for follow-up of type 1 diabetes. she is accompanied to this visit by her mother.  1. Leslie Mccarthy was diagnosed with type 1 diabetes on May 22, 2016. She had been complaining of polyuria/polydipsia/polyphagia with weight loss of about 16 pounds. She was seen by her PCP who determined that her blood sugar was 462 on POC and 513 on labs. They sent the family to the ER at [facility] where she was found to have type 1 diabetes. She was transitioned to [facility] for admission. She was admitted to the pediatric ward for introduction of insulin and diabetes education. She was started on MDI with Lantus and Novolog. She was transitioned to Humalog based on insurance coverage. She had a hemoglobin a1c of 12.6%. 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  2. Since last visit to [facility] on 02/2023   she has been well.    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   Using Omnipod 5 insulin pump and Dexcom G6. Both have worked well overall. She had one pod that caused a mild skin infection that she treated with topical antibiotics. She is bolusing before eating, usually 10-15 minutes. Does well with carb counting and estimates 30-40 grams per meal. Hypoglycemia does not occur often, none severe. She is able to feel symptoms when she is under 70.   Concerns:  - Blood sugars run high when she eats out. She under estimates her carbs.    Insulin regimen:  (when using injections) 32 units of Lantus,  Humalog 100/20/3 plan \ Omnipod 5  Basal (Max: 3.0 units/hr) 12AM 1.40  6AM 1.55  11PM 1.40                 Total: 36.15 units   Insulin to carbohydrate ratio (ICR)  12AM 3  12PM 3  4:30PM 3                   Max Bolus: 30 units   Insulin Sensitivity Factor (ISF) 12AM 20                               Target BG 12AM 110                               Hypoglycemia: Able to feel low blood sugars.  No glucagon needed recently.  Feels sweaty and shaky.  Insulin Pump Download    Med-alert IaD: Not currently wearing. Injection sites: Arms, abdomen and legs  Annual labs due: 11/2023 Ophthalmology due: 03/2024. No retinopathy at her last exam.  Discussed importance today.     3. ROS: Greater than 10 systems reviewed with pertinent positives listed in HPI, otherwise neg. Constitutional: Sleeping well. 2 lbs weight gain  Eyes: No changes in vision. No blurry vision.  Ears/Nose/Mouth/Throat: No difficulty swallowing. No neck swelling  Cardiovascular: Denies palpitation. No chest pain  Respiratory: No increased work of breathing. No SOB Gastrointestinal: No constipation . Denies abdominal pain.  Genitourinary: No nocturia, no polyuria Neurologic: Normal sensation, no tremor Endocrine: No polydipsia.  No hyperpigmentation Psychiatric: Normal affect. Denies anxiety  and depression.   Past Medical History:   Past Medical History:  Diagnosis Date   Diabetes mellitus without complication (HCC)     Medications:  Outpatient Encounter Medications as of 06/13/2023  Medication Sig Note   albuterol (PROVENTIL HFA;VENTOLIN HFA) 108 (90 Base) MCG/ACT inhaler Inhale 2 puffs into the lungs every 6 (six) hours as needed for wheezing or shortness of breath.    B-D UF III MINI PEN NEEDLES 31G X 5 MM MISC USE AS DIRECTED TO INJECT INSULIN 6 TIMES DAILY    Blood Glucose Monitoring Suppl (ONETOUCH VERIO IQ SYSTEM) w/Device KIT Use to check blood glucose    carbamide peroxide (DEBROX) 6.5 % OTIC solution Place 5 drops into both ears 2 (two) times daily.    Continuous Blood Gluc Receiver (DEXCOM G6 RECEIVER) DEVI 1 Device by Does not apply route as directed.    Continuous Blood Gluc Sensor  (DEXCOM G6 SENSOR) MISC Change sensor every 10 days    Continuous Glucose Transmitter (DEXCOM G6 TRANSMITTER) MISC USE AS DIRECTED FOR 90 DAYS    fluticasone (FLONASE) 50 MCG/ACT nasal spray Place 1 spray into both nostrils 2 (two) times daily.    Glucagon (BAQSIMI TWO PACK) 3 MG/DOSE POWD Place 3 mg into the nose as needed. 12/13/2021: PRN emergencies   Insulin Disposable Pump (OMNIPOD 5 G6 PODS, GEN 5,) MISC INJECT 1 DEVICE INTO THE SKIN AS DIRECTED. CHANGE POD EVERY 2 DAYS.    insulin lispro (HUMALOG KWIKPEN) 100 UNIT/ML KwikPen INJECT UP TO 50 UNITS PER DAY    insulin lispro (HUMALOG) 100 UNIT/ML injection INJECT UP TO 200 UNITS INTO INSULIN PUMP EVERY 2-3 DAYS PER PROVIDER GUIDANCE.    HYDROcodone-acetaminophen (NORCO/VICODIN) 5-325 MG tablet One tablet every four hours as needed for acute pain.  Limit of five days per [place] statue. (Patient not taking: Reported on 11/14/2022)    ONETOUCH VERIO test strip CHECK BLOOD SUGAR 6X DAY (90 DAY SUPPLY) (Patient not taking: Reported on 11/14/2022)    pseudoephedrine (SUDAFED) 60 MG tablet Take 1 tablet (60 mg total) by mouth every 8 (eight) hours as needed for congestion. (Patient not taking: Reported on 06/13/2023)    No facility-administered encounter medications on file as of 06/13/2023.    Allergies: No Known Allergies  Surgical History: History reviewed. No pertinent surgical history.  Family History:  History reviewed. No pertinent family history.    Social History: Lives with: Mother and Father  Graduated. Working as Geophysicist/field seismologist.   Physical Exam:  Vitals:   06/13/23 1503  BP: 110/70  Pulse: 80  Weight: 164 lb 9.6 oz (74.7 kg)      BP 110/70   Pulse 80   Wt 164 lb 9.6 oz (74.7 kg)   BMI 25.78 kg/m  Body mass index: body mass index is 25.78 kg/m. Growth %ile SmartLinks can only be used for patients less than 70 years old.  Ht Readings from Last 3 Encounters:  09/27/22 5\' 7"  (1.702 m)  09/14/22 5' 7.5" (1.715 m)   01/14/21 5' 7.52" (1.715 m) (90%, Z= 1.29)*   * Growth percentiles are based on CDC (Girls, 2-20 Years) data.   Wt Readings from Last 3 Encounters:  06/13/23 164 lb 9.6 oz (74.7 kg)  03/13/23 162 lb (73.5 kg)  11/14/22 170 lb 3.2 oz (77.2 kg)    PHYSICAL EXAM: General: Well developed, well nourished female in no acute distress.   Head: Normocephalic, atraumatic.   Eyes:  Pupils equal and round. EOMI.  Sclera white.  No eye drainage.   Ears/Nose/Mouth/Throat: Nares patent, no nasal drainage.  Normal dentition, mucous membranes moist.   Neck: supple, no cervical lymphadenopathy, no thyromegaly Cardiovascular: regular rate, normal S1/S2, no murmurs Respiratory: No increased work of breathing.  Lungs clear to auscultation bilaterally.  No wheezes. Abdomen: soft, nontender, nondistended. No appreciable masses  Extremities: warm, well perfused, cap refill < 2 sec.   Musculoskeletal: Normal muscle mass.  Normal strength Skin: warm, dry.  No rash or lesions. Neurologic: alert and oriented, normal speech, no tremor   Labs: Last hemoglobin A1c:6.5 % on 02/2023 Lab Results  Component Value Date   HGBA1C 6.9 (A) 06/13/2023   Results for orders placed or performed in visit on 06/13/23  POCT glycosylated hemoglobin (Hb A1C)  Result Value Ref Range   Hemoglobin A1C 6.9 (A) 4.0 - 5.6 %   HbA1c POC (<> result, manual entry)     HbA1c, POC (prediabetic range)     HbA1c, POC (controlled diabetic range)    POCT Glucose (Device for Home Use)  Result Value Ref Range   Glucose Fasting, POC     POC Glucose 117 (A) 70 - 99 mg/dl    Assessment/Plan: Azriella is a 22 y.o. female with  type 1 diabetes on Omnipod insulin pump therapy. Leslie Mccarthy has done a great job with her diabetes care. Her hemoglobin A1c is 6.9% today which meets ADA goal of <7%. Time in target range is 71 which meets goal of >70%.  She is bolusing more consistently. Her time in target range is 62% and hemoglobin A1c has  1. DM  w/o complication type I, uncontrolled (HCC)/ 2. Hyperglycemia  - Reviewed insulin pump and CGM download. Discussed trends and patterns.  - Rotate pump sites to prevent scar tissue.  - bolus 15 minutes prior to eating to limit blood sugar spikes.  - Reviewed carb counting and importance of accurate carb counting.  - Discussed signs and symptoms of hypoglycemia. Always have glucose available.  - POCT glucose and hemoglobin A1c  - Reviewed growth chart.  - Encouraged to clean well with alcohol swabs before applying pods.   3. Insulin pump titration/pump in place -Pump in place.    Follow-up:   3 month.   LOS: >40  spent today reviewing the medical chart, counseling the patient/family, and documenting today's visit.   When a patient is on insulin, intensive monitoring of blood glucose levels is necessary to avoid hyperglycemia and hypoglycemia. Severe hyperglycemia/hypoglycemia can lead to hospital admissions and be life threatening.     Gretchen Short,  FNP-C  Pediatric Specialist  [address]  [place], [address]  Tele: [phone]

## 2023-09-06 ENCOUNTER — Ambulatory Visit
Admission: EM | Admit: 2023-09-06 | Discharge: 2023-09-06 | Disposition: A | Discharge status: Home / Self Care | Payer: BC Managed Care – PPO

## 2023-09-06 ENCOUNTER — Ambulatory Visit: Payer: BC Managed Care – PPO

## 2023-09-06 ENCOUNTER — Encounter: Payer: Self-pay | Admitting: *Deleted

## 2023-09-06 DIAGNOSIS — M79671 Pain in right foot: Secondary | ICD-10-CM | POA: Diagnosis not present

## 2023-09-06 DIAGNOSIS — S92321D Displaced fracture of second metatarsal bone, right foot, subsequent encounter for fracture with routine healing: Secondary | ICD-10-CM | POA: Diagnosis not present

## 2023-09-06 NOTE — Discharge Instructions (Addendum)
I will call you later today if the x-ray shows that your foot is broken.  As we discussed, I did not see any broken bones on the x-ray but the radiologist has not formally read the report yet.  In the meantime, recommend rest, ice, compression, elevation.  You can also use the walking boot that you were given when your foot was broken before.  You can take Tylenol 500 to 1000 mg every 6 hours alternating with ibuprofen 800 mg every 8 hours as needed for pain.  Seek care if symptoms persist or worsen despite treatment.

## 2023-09-06 NOTE — ED Provider Notes (Signed)
[facility]    CSN: [id number] Arrival date & time: 09/06/23  1254      History   Chief Complaint Chief Complaint  Patient presents with   Foot Pain    HPI Leslie Mccarthy is a 22 y.o. female.   Patient presents today with 1 day history of right foot pain.  Reports it began hurting after taking off her shoes after playing vascular yesterday.  Denies any known injury while playing basketball, however reports she has broken her right foot in the past and had to wear a walking boot.  She endorses swelling to the inside of her foot and pain on the bottom of her foot when she walks.  Has been able to walk but is walking on the outside of her right foot.  Denies ankle pain or decreased range of motion of the foot or ankle.  Pain is worse with weightbearing.  Has not taken anything or tried nothing for symptoms so far.      Past Medical History:  Diagnosis Date   Diabetes mellitus without complication [facility])     Patient Active Problem List   Diagnosis Date Noted   Seizures (HCC) 03/13/2023   Hypoglycemia due to type 1 diabetes mellitus (HCC) 08/20/2019   Amenorrhea, secondary 12/18/2018   Constipation in pediatric patient 12/18/2018   Type 1 diabetes mellitus with other specified complication (HCC) 01/05/2017   Hyperglycemia 05/22/2016    History reviewed. No pertinent surgical history.  OB History   No obstetric history on file.      Home Medications    Prior to Admission medications   Medication Sig Start Date End Date Taking? Authorizing Provider  albuterol (PROVENTIL HFA;VENTOLIN HFA) 108 (90 Base) MCG/ACT inhaler Inhale 2 puffs into the lungs every 6 (six) hours as needed for wheezing or shortness of breath.    [provider]  B-D UF III MINI PEN NEEDLES 31G X 5 MM MISC USE AS DIRECTED TO INJECT INSULIN 6 TIMES DAILY 08/17/22   Gretchen Short, NP  Blood Glucose Monitoring Suppl (ONETOUCH VERIO IQ SYSTEM) w/Device KIT Use to check blood  glucose 06/30/16   Dessa Phi, MD  buPROPion (WELLBUTRIN XL) 150 MG 24 hr tablet Indications: depression 03/11/22   [provider]  carbamide peroxide (DEBROX) 6.5 % OTIC solution Place 5 drops into both ears 2 (two) times daily. 05/07/23   Particia Nearing, PA-C  Continuous Blood Gluc Receiver (DEXCOM G6 RECEIVER) DEVI 1 Device by Does not apply route as directed. 06/01/20   Gretchen Short, NP  Continuous Blood Gluc Sensor (DEXCOM G6 SENSOR) MISC Change sensor every 10 days 11/28/22   Gretchen Short, NP  Continuous Glucose Transmitter (DEXCOM G6 TRANSMITTER) MISC USE AS DIRECTED FOR 90 DAYS 03/09/23   Gretchen Short, NP  fluticasone (FLONASE) 50 MCG/ACT nasal spray Place 1 spray into both nostrils 2 (two) times daily. 05/07/23   Particia Nearing, PA-C  Glucagon (BAQSIMI TWO PACK) 3 MG/DOSE POWD Place 3 mg into the nose as needed. 06/02/21   Gretchen Short, NP  HYDROcodone-acetaminophen (NORCO/VICODIN) 5-325 MG tablet One tablet every four hours as needed for acute pain.  Limit of five days per [place]. Patient not taking: Reported on 11/14/2022 09/14/22   Darreld Mclean, MD  Insulin Disposable Pump (OMNIPOD 5 G6 PODS, GEN 5,) MISC INJECT 1 DEVICE INTO THE SKIN AS DIRECTED. CHANGE POD EVERY 2 DAYS. 05/17/23   Gretchen Short, NP  insulin lispro (HUMALOG KWIKPEN) 100 UNIT/ML KwikPen INJECT  UP TO 50 UNITS PER DAY 01/06/23   Gretchen Short, NP  insulin lispro (HUMALOG) 100 UNIT/ML injection INJECT UP TO 200 UNITS INTO INSULIN PUMP EVERY 2-3 DAYS PER PROVIDER GUIDANCE. 03/22/23   Gretchen Short, NP  ONETOUCH VERIO test strip CHECK BLOOD SUGAR 6X DAY (90 DAY SUPPLY) Patient not taking: Reported on 11/14/2022 01/20/20   Gretchen Short, NP  pseudoephedrine (SUDAFED) 60 MG tablet Take 1 tablet (60 mg total) by mouth every 8 (eight) hours as needed for congestion. Patient not taking: Reported on 06/13/2023 05/07/23   Particia Nearing, PA-C    Family History Family  History  Problem Relation Age of Onset   Healthy Mother    Healthy Father     Social History Social History   Tobacco Use   Smoking status: Never   Smokeless tobacco: Never  Vaping Use   Vaping status: Every Day  Substance Use Topics   Alcohol use: No   Drug use: No     Allergies   Patient has no known allergies.   Review of Systems Review of Systems Per HPI  Physical Exam Triage Vital Signs ED Triage Vitals  Encounter Vitals Group     BP 09/06/23 1334 115/76     Systolic BP Percentile --      Diastolic BP Percentile --      Pulse Rate 09/06/23 1334 82     Resp 09/06/23 1334 16     Temp 09/06/23 1334 97.9 F (36.6 C)     Temp Source 09/06/23 1334 Oral     SpO2 09/06/23 1334 98 %     Weight --      Height --      Head Circumference --      Peak Flow --      Pain Score 09/06/23 1331 6     Pain Loc --      Pain Education --      Exclude from Growth Chart --    No data found.  Updated Vital Signs BP 115/76 (BP Location: Right Arm)   Pulse 82   Temp 97.9 F (36.6 C) (Oral)   Resp 16   LMP 09/03/2023 (Exact Date)   SpO2 98%   Visual Acuity Right Eye Distance:   Left Eye Distance:   Bilateral Distance:    Right Eye Near:   Left Eye Near:    Bilateral Near:     Physical Exam Vitals and nursing note reviewed.  Constitutional:      General: She is not in acute distress.    Appearance: Normal appearance. She is not toxic-appearing.  HENT:     Mouth/Throat:     Mouth: Mucous membranes are moist.     Pharynx: Oropharynx is clear.  Pulmonary:     Effort: Pulmonary effort is normal. No respiratory distress.  Feet:     Comments: Inspection: Mild swelling to the medial right foot near plantar surface; no bruising, obvious deformity or redness Palpation: tender to palpation right dorsal and plantar foot diffusely; no obvious deformities palpated ROM: Full ROM to right foot and ankle, although there is pain with flexion of right foot Strength: 5/5  right lower extremity Neurovascular: neurovascularly intact right lower extremity Skin:    General: Skin is warm and dry.     Capillary Refill: Capillary refill takes less than 2 seconds.     Coloration: Skin is not jaundiced or pale.     Findings: No erythema.  Neurological:     Mental  Status: She is alert and oriented to person, place, and time.  Psychiatric:        Behavior: Behavior is cooperative.      UC Treatments / Results  Labs (all labs ordered are listed, but only abnormal results are displayed) Labs Reviewed - No data to display  EKG   Radiology No results found.  Procedures Procedures (including critical care time)  Medications Ordered in UC Medications - No data to display  Initial Impression / Assessment and Plan / UC Course  I have reviewed the triage vital signs and the nursing notes.  Pertinent labs & imaging results that were available during my care of the patient were reviewed by me and considered in my medical decision making (see chart for details).   Patient is well-appearing, normotensive, afebrile, not tachycardic, not tachypneic, oxygenating well on room air.    1. Right foot pain Suspect foot strain or sprain X-ray imaging pending at time of discharge, will contact patient if x-ray shows any fracture Recommended Ace wrap, cam boot, Tylenol/ibuprofen as needed for pain, rest, ice, compression, elevation Return and ER precautions discussed with patient  The patient was given the opportunity to ask questions.  All questions answered to their satisfaction.  The patient is in agreement to this plan.    Final Clinical Impressions(s) / UC Diagnoses   Final diagnoses:  Right foot pain     Discharge Instructions      I will call you later today if the x-ray shows that your foot is broken.  As we discussed, I did not see any broken bones on the x-ray but the radiologist has not formally read the report yet.  In the meantime, recommend rest,  ice, compression, elevation.  You can also use the walking boot that you were given when your foot was broken before.  You can take Tylenol 500 to 1000 mg every 6 hours alternating with ibuprofen 800 mg every 8 hours as needed for pain.  Seek care if symptoms persist or worsen despite treatment.     ED Prescriptions   None    PDMP not reviewed this encounter.   Valentino Nose, NP 09/06/23 1447

## 2023-09-06 NOTE — ED Triage Notes (Signed)
Pt states that she broke her right foot about a year ago, yesterday she started to have right pain while playing basketball. She hasn't taken any meds.

## 2023-09-08 ENCOUNTER — Telehealth (INDEPENDENT_AMBULATORY_CARE_PROVIDER_SITE_OTHER): Payer: Self-pay

## 2023-09-08 NOTE — Telephone Encounter (Signed)
Received fax from pharmacy/covermymeds to complete prior authorization initiated on covermymeds, completed prior authorization  Pharmacy would like notification of determination [facility]  [phone] F: [phone] Determination faxed to pharmacy

## 2023-09-20 ENCOUNTER — Ambulatory Visit (INDEPENDENT_AMBULATORY_CARE_PROVIDER_SITE_OTHER): Payer: BC Managed Care – PPO | Admitting: Family

## 2023-10-25 NOTE — L&D Delivery Note (Addendum)
 Delivery Note Pt labored well to complete. She pushed for about and at 3:03 AM a viable female was delivered via Vaginal, Spontaneous (Presentation: Left Occiput Anterior).  APGAR: 7, 8; weight 7 lb 13.6 oz (3560 g).   Anterior and posterior shoulders delivered in McRoberts position easily; body followed. Infant was placed on mothers' abdomen where cord was clamped and cut a minute after delivery. Cord blood was obtained.  Placenta status: Spontaneous, Intact.  Cord: Keren. 3 vessels with the following complications: None.  Cord pH: n/a  Infant was taken to warmer for delee suction but returned to mother for skin to skin   Anesthesia: Epidural Episiotomy: None Lacerations: 2nd degree;Vaginal Suture Repair: 2.0 vicryl Est. Blood Loss (mL): 550  Mom to postpartum.  Baby to Couplet care / Skin to Skin. Glucose monitoring and treatment with diabetic counselor assistance requested  Ted LELON Solo 06/21/2024, 3:38 AM

## 2023-11-08 ENCOUNTER — Encounter (INDEPENDENT_AMBULATORY_CARE_PROVIDER_SITE_OTHER): Payer: Self-pay

## 2023-11-11 ENCOUNTER — Other Ambulatory Visit (INDEPENDENT_AMBULATORY_CARE_PROVIDER_SITE_OTHER): Payer: Self-pay | Admitting: Family

## 2023-11-11 DIAGNOSIS — E109 Type 1 diabetes mellitus without complications: Secondary | ICD-10-CM

## 2023-11-13 DIAGNOSIS — Z3201 Encounter for pregnancy test, result positive: Secondary | ICD-10-CM | POA: Diagnosis not present

## 2023-11-30 ENCOUNTER — Encounter (INDEPENDENT_AMBULATORY_CARE_PROVIDER_SITE_OTHER): Payer: Self-pay

## 2023-11-30 ENCOUNTER — Ambulatory Visit (INDEPENDENT_AMBULATORY_CARE_PROVIDER_SITE_OTHER): Payer: BC Managed Care – PPO | Admitting: Family

## 2023-11-30 DIAGNOSIS — E109 Type 1 diabetes mellitus without complications: Secondary | ICD-10-CM

## 2023-12-01 ENCOUNTER — Encounter (INDEPENDENT_AMBULATORY_CARE_PROVIDER_SITE_OTHER): Payer: Self-pay

## 2023-12-01 ENCOUNTER — Telehealth (INDEPENDENT_AMBULATORY_CARE_PROVIDER_SITE_OTHER): Payer: Self-pay | Admitting: Family

## 2023-12-01 ENCOUNTER — Other Ambulatory Visit (INDEPENDENT_AMBULATORY_CARE_PROVIDER_SITE_OTHER): Payer: Self-pay | Admitting: Family

## 2023-12-01 DIAGNOSIS — E1065 Type 1 diabetes mellitus with hyperglycemia: Secondary | ICD-10-CM

## 2023-12-01 DIAGNOSIS — O24019 Pre-existing diabetes mellitus, type 1, in pregnancy, unspecified trimester: Secondary | ICD-10-CM

## 2023-12-01 NOTE — Telephone Encounter (Signed)
 Please call [place] endocrine and get scheduled ASAP. Patient recently positive for pregnancy and has US  next week. Has type 1 diabetes and needs urgent appointment. She will no longer be scheduled with us  since we do not take care of pregnancy with diabetes.

## 2023-12-02 ENCOUNTER — Other Ambulatory Visit (INDEPENDENT_AMBULATORY_CARE_PROVIDER_SITE_OTHER): Payer: Self-pay | Admitting: Family

## 2023-12-02 DIAGNOSIS — E109 Type 1 diabetes mellitus without complications: Secondary | ICD-10-CM

## 2023-12-04 ENCOUNTER — Other Ambulatory Visit (INDEPENDENT_AMBULATORY_CARE_PROVIDER_SITE_OTHER): Payer: Self-pay | Admitting: Family

## 2023-12-04 DIAGNOSIS — O26891 Other specified pregnancy related conditions, first trimester: Secondary | ICD-10-CM | POA: Diagnosis not present

## 2023-12-04 DIAGNOSIS — Z1330 Encounter for screening examination for mental health and behavioral disorders, unspecified: Secondary | ICD-10-CM | POA: Diagnosis not present

## 2023-12-04 DIAGNOSIS — O24019 Pre-existing diabetes mellitus, type 1, in pregnancy, unspecified trimester: Secondary | ICD-10-CM | POA: Insufficient documentation

## 2023-12-04 DIAGNOSIS — Z3A08 8 weeks gestation of pregnancy: Secondary | ICD-10-CM | POA: Diagnosis not present

## 2023-12-04 DIAGNOSIS — Z3490 Encounter for supervision of normal pregnancy, unspecified, unspecified trimester: Secondary | ICD-10-CM | POA: Insufficient documentation

## 2023-12-04 DIAGNOSIS — R5383 Other fatigue: Secondary | ICD-10-CM

## 2023-12-04 DIAGNOSIS — Z124 Encounter for screening for malignant neoplasm of cervix: Secondary | ICD-10-CM | POA: Diagnosis not present

## 2023-12-04 DIAGNOSIS — Z113 Encounter for screening for infections with a predominantly sexual mode of transmission: Secondary | ICD-10-CM | POA: Diagnosis not present

## 2023-12-04 DIAGNOSIS — Z3689 Encounter for other specified antenatal screening: Secondary | ICD-10-CM | POA: Diagnosis not present

## 2023-12-04 DIAGNOSIS — Z3A10 10 weeks gestation of pregnancy: Secondary | ICD-10-CM | POA: Diagnosis not present

## 2023-12-04 LAB — OB RESULTS CONSOLE HEPATITIS B SURFACE ANTIGEN: Hepatitis B Surface Ag: NEGATIVE

## 2023-12-04 LAB — OB RESULTS CONSOLE HIV ANTIBODY (ROUTINE TESTING): HIV: NONREACTIVE

## 2023-12-04 LAB — OB RESULTS CONSOLE GC/CHLAMYDIA
Chlamydia: NEGATIVE
Neisseria Gonorrhea: NEGATIVE

## 2023-12-04 LAB — OB RESULTS CONSOLE RPR: RPR: NONREACTIVE

## 2023-12-04 LAB — HEPATITIS C ANTIBODY: HCV Ab: NEGATIVE

## 2023-12-04 LAB — OB RESULTS CONSOLE ABO/RH: RH Type: POSITIVE

## 2023-12-04 LAB — OB RESULTS CONSOLE RUBELLA ANTIBODY, IGM: Rubella: IMMUNE

## 2023-12-04 LAB — OB RESULTS CONSOLE ANTIBODY SCREEN: Antibody Screen: NEGATIVE

## 2023-12-05 MED ORDER — DEXCOM G6 SENSOR MISC
1 refills | Status: DC
Start: 2023-12-05 — End: 2024-03-04

## 2023-12-05 MED ORDER — INSULIN LISPRO 100 UNIT/ML IJ SOLN
INTRAMUSCULAR | 1 refills | Status: DC
Start: 2023-12-05 — End: 2024-01-09

## 2023-12-11 ENCOUNTER — Encounter (INDEPENDENT_AMBULATORY_CARE_PROVIDER_SITE_OTHER): Payer: Self-pay | Admitting: Family

## 2023-12-11 ENCOUNTER — Ambulatory Visit (INDEPENDENT_AMBULATORY_CARE_PROVIDER_SITE_OTHER): Payer: BC Managed Care – PPO | Admitting: Family

## 2023-12-11 VITALS — BP 124/82 | HR 89 | Wt 173.6 lb

## 2023-12-11 DIAGNOSIS — E1065 Type 1 diabetes mellitus with hyperglycemia: Secondary | ICD-10-CM | POA: Diagnosis not present

## 2023-12-11 DIAGNOSIS — E1069 Type 1 diabetes mellitus with other specified complication: Secondary | ICD-10-CM | POA: Diagnosis not present

## 2023-12-11 DIAGNOSIS — R590 Localized enlarged lymph nodes: Secondary | ICD-10-CM | POA: Diagnosis not present

## 2023-12-11 DIAGNOSIS — Z4681 Encounter for fitting and adjustment of insulin pump: Secondary | ICD-10-CM | POA: Insufficient documentation

## 2023-12-11 DIAGNOSIS — O99340 Other mental disorders complicating pregnancy, unspecified trimester: Secondary | ICD-10-CM | POA: Insufficient documentation

## 2023-12-11 DIAGNOSIS — R5383 Other fatigue: Secondary | ICD-10-CM | POA: Diagnosis not present

## 2023-12-11 DIAGNOSIS — Z3A10 10 weeks gestation of pregnancy: Secondary | ICD-10-CM

## 2023-12-11 LAB — POCT GLYCOSYLATED HEMOGLOBIN (HGB A1C): Hemoglobin A1C: 7.2 % — AB (ref 4.0–5.6)

## 2023-12-11 LAB — POCT GLUCOSE (DEVICE FOR HOME USE): POC Glucose: 138 mg/dL — AB (ref 70–99)

## 2023-12-11 NOTE — Progress Notes (Addendum)
 Pediatric Endocrinology Diabetes Consultation Follow-up Visit  Leslie Mccarthy 04/08/2001 [id number]  Chief Complaint: Follow-up type 1 diabetes   Leslie Mccarthy., PA-C   HPI: Leslie Mccarthy  is a 23 y.o. female presenting for follow-up of type 1 diabetes. she is accompanied to this visit by her mother.  1. Leslie Mccarthy was diagnosed with type 1 diabetes on May 22, 2016. She had been complaining of polyuria/polydipsia/polyphagia with weight loss of about 16 pounds. She was seen by her PCP who determined that her blood sugar was 462 on POC and 513 on labs. They sent the family to the ER at [facility] where she was found to have type 1 diabetes. She was transitioned to [facility] for admission. She was admitted to the pediatric ward for introduction of insulin and diabetes education. She was started on MDI with Lantus and Novolog. She was transitioned to Humalog based on insurance coverage. She had a hemoglobin a1c of 12.6%. 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  2. Since last visit to [facility] on 08/2023   she has been well.    She is [redacted] weeks pregnant and is following closely with OBGYN. She states that she has been very fatigued since finding out she is pregnant. She also has a "lump" on her left side of neck that is painful to touch and started one week after being sick.  No discharge. No fevers.   She feels like her blood sugars have been overall well controlled on OMnipod 5. Her appetite is much stronger and she is having to change her pump every 2 days. Dexcom G6 is working well for her. She is bolusing at least 15 minutes before eating at most meals. Carb intake at meals is at least 40 grams per meal. No severe hypoglycemia, she is able to feel symptoms when hypoglycemic.   She reports that her appetite has increased and she is crazing more "junk food" which runs her blood sugars higher. When she does not eat as much junk food her blood sugars are more stable.   Insulin  regimen:  (when using injections) 32 units of Lantus,  Humalog 100/20/3 plan \ Omnipod 5  Basal (Max: 3.0 units/hr) 12AM 1.40  6AM 1.55  11PM 1.40                 Total: 36.15 units   Insulin to carbohydrate ratio (ICR)  12AM 3  12PM 3  4:30PM 3                  Max Bolus: 30 units   Insulin Sensitivity Factor (ISF) 12AM 20                               Target BG 12AM 110                               Hypoglycemia: Able to feel low blood sugars.  No glucagon needed recently.  Feels sweaty and shaky.  Insulin Pump Download    Med-alert IaD: Not currently wearing. Injection sites: Arms, abdomen and legs  Annual labs due: 11/2023 Ophthalmology due: 03/2024. No retinopathy at her last exam.  Discussed importance today.     3. ROS: Greater than 10 systems reviewed with pertinent positives listed in HPI, otherwise neg. Constitutional: Sleeping well. 9 lbs weight gain  Eyes: No changes in vision.  No blurry vision.  Ears/Nose/Mouth/Throat: No difficulty swallowing. No neck swelling  Cardiovascular: Denies palpitation. No chest pain  Respiratory: No increased work of breathing. No SOB Gastrointestinal: No constipation . Denies abdominal pain.  Genitourinary: No nocturia, no polyuria Neurologic: Normal sensation, no tremor Endocrine: No polydipsia.  No hyperpigmentation Psychiatric: Normal affect. Denies anxiety and depression.   Past Medical History:   Past Medical History:  Diagnosis Date   Diabetes mellitus without complication (HCC)     Medications:  Outpatient Encounter Medications as of 12/11/2023  Medication Sig Note   carbamide peroxide (DEBROX) 6.5 % OTIC solution Place 5 drops into both ears 2 (two) times daily.    Continuous Glucose Sensor (DEXCOM G6 SENSOR) MISC Change sensor every 10 days    Continuous Glucose Transmitter (DEXCOM G6 TRANSMITTER) MISC USE AS DIRECTED FOR 90 DAYS    Insulin Disposable Pump (OMNIPOD 5 DEXG7G6 PODS GEN 5) MISC  INJECT 1 DEVICE INTO THE SKIN AS DIRECTED. CHANGE POD EVERY 2 DAYS.    insulin lispro (HUMALOG) 100 UNIT/ML injection INJECT UP TO 200 UNITS INTO INSULIN PUMP EVERY 2-3 DAYS PER PROVIDER GUIDANCE.    albuterol (PROVENTIL HFA;VENTOLIN HFA) 108 (90 Base) MCG/ACT inhaler Inhale 2 puffs into the lungs every 6 (six) hours as needed for wheezing or shortness of breath. (Patient not taking: Reported on 12/11/2023)    B-D UF III MINI PEN NEEDLES 31G X 5 MM MISC USE AS DIRECTED TO INJECT INSULIN 6 TIMES DAILY (Patient not taking: Reported on 12/11/2023)    Blood Glucose Monitoring Suppl (ONETOUCH VERIO IQ SYSTEM) w/Device KIT Use to check blood glucose (Patient not taking: Reported on 12/11/2023)    buPROPion (WELLBUTRIN XL) 150 MG 24 hr tablet Indications: depression (Patient not taking: Reported on 12/11/2023)    Continuous Blood Gluc Receiver (DEXCOM G6 RECEIVER) DEVI 1 Device by Does not apply route as directed. (Patient not taking: Reported on 12/11/2023)    fluticasone (FLONASE) 50 MCG/ACT nasal spray Place 1 spray into both nostrils 2 (two) times daily. (Patient not taking: Reported on 12/11/2023)    Glucagon (BAQSIMI TWO PACK) 3 MG/DOSE POWD Place 3 mg into the nose as needed. (Patient not taking: Reported on 12/11/2023) 12/13/2021: PRN emergencies   HYDROcodone-acetaminophen (NORCO/VICODIN) 5-325 MG tablet One tablet every four hours as needed for acute pain.  Limit of five days per [place]. (Patient not taking: Reported on 12/11/2023)    insulin lispro (HUMALOG KWIKPEN) 100 UNIT/ML KwikPen INJECT UP TO 50 UNITS PER DAY (Patient not taking: Reported on 12/11/2023)    ONETOUCH VERIO test strip CHECK BLOOD SUGAR 6X DAY (90 DAY SUPPLY) (Patient not taking: Reported on 12/11/2023)    pseudoephedrine (SUDAFED) 60 MG tablet Take 1 tablet (60 mg total) by mouth every 8 (eight) hours as needed for congestion. (Patient not taking: Reported on 12/11/2023)    No facility-administered encounter medications on file as  of 12/11/2023.    Allergies: No Known Allergies  Surgical History: History reviewed. No pertinent surgical history.  Family History:  Family History  Problem Relation Age of Onset   Healthy Mother    Healthy Father       Social History: Lives with: Mother and Father  Graduated. Working as Geophysicist/field seismologist.   Physical Exam:  Vitals:   12/11/23 0938  BP: 124/82  Pulse: 89  Weight: 173 lb 9.6 oz (78.7 kg)       BP 124/82 (BP Location: Left Wrist, Patient Position: Sitting, Cuff Size: Small)   Pulse  89   Wt 173 lb 9.6 oz (78.7 kg)   LMP  (LMP Unknown)   BMI 27.19 kg/m  Body mass index: body mass index is 27.19 kg/m. Growth %ile SmartLinks can only be used for patients less than 33 years old.  Ht Readings from Last 3 Encounters:  09/27/22 5\' 7"  (1.702 m)  09/14/22 5' 7.5" (1.715 m)  01/14/21 5' 7.52" (1.715 m) (90%, Z= 1.29)*   * Growth percentiles are based on CDC (Girls, 2-20 Years) data.   Wt Readings from Last 3 Encounters:  12/11/23 173 lb 9.6 oz (78.7 kg)  06/13/23 164 lb 9.6 oz (74.7 kg)  03/13/23 162 lb (73.5 kg)    PHYSICAL EXAM: General: Well developed, well nourished female in no acute distress.   Head: Normocephalic, atraumatic.   Eyes:  Pupils equal and round. EOMI.   Sclera white.  No eye drainage.   Ears/Nose/Mouth/Throat: Nares patent, no nasal drainage.  Normal dentition, mucous membranes moist.   Neck: supple, + cervical lymphadenopathy to left side (one inflammed node) , no thyromegaly Cardiovascular: regular rate, normal S1/S2, no murmurs Respiratory: No increased work of breathing.  Lungs clear to auscultation bilaterally.  No wheezes. Abdomen: soft, nontender, nondistended. No appreciable masses  Extremities: warm, well perfused, cap refill < 2 sec.   Musculoskeletal: Normal muscle mass.  Normal strength Skin: warm, dry.  No rash or lesions. Neurologic: alert and oriented, normal speech, no tremor   Labs: Last hemoglobin A1c:6.5  % on 02/2023 Lab Results  Component Value Date   HGBA1C 7.2 (A) 12/11/2023   Results for orders placed or performed in visit on 12/11/23  POCT Glucose (Device for Home Use)   Collection Time: 12/11/23  9:48 AM  Result Value Ref Range   Glucose Fasting, POC     POC Glucose 138 (A) 70 - 99 mg/dl  POCT glycosylated hemoglobin (Hb A1C)   Collection Time: 12/11/23  9:51 AM  Result Value Ref Range   Hemoglobin A1C 7.2 (A) 4.0 - 5.6 %   HbA1c POC (<> result, manual entry)     HbA1c, POC (prediabetic range)     HbA1c, POC (controlled diabetic range)      Assessment/Plan: Lenola is a 23 y.o. female with  type 1 diabetes on Omnipod insulin pump therapy. She is pregnant and being followed by OBGYN, will also establish with adult endocrinology ASAP. Currently she is having hyperglycemic spikes after meals but likely due to increased intake of "candy and junk". Hemoglobin A1c is 7.2% which is higher then ADA goal of <7%. Time in target range is 65%, below goal of >70%.   1. DM w/o complication type I, uncontrolled (HCC)/pregnancy  2. Hyperglycemia  - Reviewed insulin pump and CGM download. Discussed trends and patterns.  - Rotate pump sites to prevent scar tissue.  - bolus 15 minutes prior to eating to limit blood sugar spikes.  - Reviewed carb counting and importance of accurate carb counting.  - Discussed signs and symptoms of hypoglycemia. Always have glucose available.  - POCT glucose and hemoglobin A1c  - Reviewed growth chart.  - Discussed pregnancy with type 1 diabetes and stressed importance of tight diabetes control limiting hyperglycemia and hypoglycemia.  - Refer to Adult endocrine placed. She has a visit on 12/2023.  Lab Orders         Lipid panel         Microalbumin / creatinine urine ratio         COMPLETE METABOLIC PANEL  WITH GFR         POCT Glucose (Device for Home Use)         POCT glycosylated hemoglobin (Hb A1C)    - TSH, Ft4  - CBC  3. Insulin pump titration/pump in  place Basal (Max: 3.0 units/hr) 12AM 1.40  6AM 1.55--> 1.65    11PM 1.40--> 1.50                  Total: 38 units   Insulin to carbohydrate ratio (ICR)  12AM 3  12PM 3  4:30PM 3                  Max Bolus: 30 units   Insulin Sensitivity Factor (ISF) 12AM 20                               Target BG 12AM 110                            4. Cervical lymphadenopathy  - Likely due to recent viral illness.  - Monitor closely, if size worsens or does not improve in the next few days or she develops fever then she should go to Urgent care immediately. Also encouraged to schedule visit with PCP.    Follow-up:   3 month.   LOS: 57 minutes spent today reviewing the medical chart, counseling the patient/family, and documenting today's visit. This time does not include CGM interpretation.     When a patient is on insulin, intensive monitoring of blood glucose levels is necessary to avoid hyperglycemia and hypoglycemia. Severe hyperglycemia/hypoglycemia can lead to hospital admissions and be life threatening.     Gretchen Short,  FNP-C  Pediatric Specialist  [address]  [place], [address]  Tele: [phone]

## 2023-12-11 NOTE — Patient Instructions (Signed)
 Basal (Max: 3.0 units/hr) 12AM 1.40  6AM 1.55--> 1.65    11PM 1.40--> 1.50                  Total: 38 units   Insulin to carbohydrate ratio (ICR)  12AM 3  12PM 3  4:30PM 3                  Max Bolus: 30 units   Insulin Sensitivity Factor (ISF) 12AM 20                               Target BG 12AM 110

## 2023-12-12 ENCOUNTER — Encounter (INDEPENDENT_AMBULATORY_CARE_PROVIDER_SITE_OTHER): Payer: Self-pay

## 2023-12-12 LAB — COMPLETE METABOLIC PANEL WITH GFR
AG Ratio: 1.4 (calc) (ref 1.0–2.5)
ALT: 30 U/L — ABNORMAL HIGH (ref 6–29)
AST: 26 U/L (ref 10–30)
Albumin: 4 g/dL (ref 3.6–5.1)
Alkaline phosphatase (APISO): 86 U/L (ref 31–125)
BUN: 7 mg/dL (ref 7–25)
CO2: 24 mmol/L (ref 20–32)
Calcium: 8.8 mg/dL (ref 8.6–10.2)
Chloride: 102 mmol/L (ref 98–110)
Creat: 0.72 mg/dL (ref 0.50–0.96)
Globulin: 2.8 g/dL (ref 1.9–3.7)
Glucose, Bld: 120 mg/dL — ABNORMAL HIGH (ref 65–99)
Potassium: 4.3 mmol/L (ref 3.5–5.3)
Sodium: 136 mmol/L (ref 135–146)
Total Bilirubin: 1 mg/dL (ref 0.2–1.2)
Total Protein: 6.8 g/dL (ref 6.1–8.1)
eGFR: 122 mL/min/{1.73_m2} (ref >=60)

## 2023-12-12 LAB — LIPID PANEL
Cholesterol: 161 mg/dL (ref <200)
HDL: 66 mg/dL (ref >=50)
LDL Cholesterol (Calc): 75 mg/dL
Non-HDL Cholesterol (Calc): 95 mg/dL (ref <130)
Total CHOL/HDL Ratio: 2.4 (calc) (ref <5.0)
Triglycerides: 118 mg/dL (ref <150)

## 2023-12-12 LAB — CBC WITH DIFFERENTIAL/PLATELET
Absolute Lymphocytes: 2413 {cells}/uL (ref 850–3900)
Absolute Monocytes: 531 {cells}/uL (ref 200–950)
Basophils Absolute: 70 {cells}/uL (ref 0–200)
Basophils Relative: 1.1 %
Eosinophils Absolute: 90 {cells}/uL (ref 15–500)
Eosinophils Relative: 1.4 %
HCT: 40.3 % (ref 35.0–45.0)
Hemoglobin: 13.1 g/dL (ref 11.7–15.5)
MCH: 29.7 pg (ref 27.0–33.0)
MCHC: 32.5 g/dL (ref 32.0–36.0)
MCV: 91.4 fL (ref 80.0–100.0)
MPV: 10.8 fL (ref 7.5–12.5)
Monocytes Relative: 8.3 %
Neutro Abs: 3296 {cells}/uL (ref 1500–7800)
Neutrophils Relative %: 51.5 %
Platelets: 245 10*3/uL (ref 140–400)
RBC: 4.41 10*6/uL (ref 3.80–5.10)
RDW: 12.2 % (ref 11.0–15.0)
Total Lymphocyte: 37.7 %
WBC: 6.4 10*3/uL (ref 3.8–10.8)

## 2023-12-12 LAB — T4: T4, Total: 12 ug/dL — ABNORMAL HIGH (ref 5.1–11.9)

## 2023-12-12 LAB — TSH: TSH: 1.1 m[IU]/L

## 2023-12-12 LAB — T4, FREE: Free T4: 1.2 ng/dL (ref 0.8–1.8)

## 2023-12-26 ENCOUNTER — Encounter (INDEPENDENT_AMBULATORY_CARE_PROVIDER_SITE_OTHER): Payer: Self-pay

## 2024-01-01 DIAGNOSIS — R718 Other abnormality of red blood cells: Secondary | ICD-10-CM | POA: Diagnosis not present

## 2024-01-08 ENCOUNTER — Encounter (INDEPENDENT_AMBULATORY_CARE_PROVIDER_SITE_OTHER): Payer: Self-pay

## 2024-01-08 DIAGNOSIS — E109 Type 1 diabetes mellitus without complications: Secondary | ICD-10-CM

## 2024-01-09 MED ORDER — INSULIN LISPRO 100 UNIT/ML IJ SOLN
INTRAMUSCULAR | 1 refills | Status: DC
Start: 2024-01-09 — End: 2024-05-27

## 2024-01-09 MED ORDER — OMNIPOD 5 DEXG7G6 PODS GEN 5 MISC
1 refills | Status: DC
Start: 2024-01-09 — End: 2024-05-27

## 2024-01-16 ENCOUNTER — Other Ambulatory Visit: Payer: Self-pay | Admitting: Student

## 2024-01-16 DIAGNOSIS — O24011 Pre-existing diabetes mellitus, type 1, in pregnancy, first trimester: Secondary | ICD-10-CM

## 2024-01-18 ENCOUNTER — Encounter: Payer: Self-pay | Admitting: Emergency Medicine

## 2024-01-18 ENCOUNTER — Other Ambulatory Visit: Payer: Self-pay

## 2024-01-18 ENCOUNTER — Ambulatory Visit
Admission: EM | Admit: 2024-01-18 | Discharge: 2024-01-18 | Disposition: A | Discharge status: Home / Self Care | Attending: Family Medicine | Admitting: Family Medicine

## 2024-01-18 DIAGNOSIS — J019 Acute sinusitis, unspecified: Secondary | ICD-10-CM

## 2024-01-18 DIAGNOSIS — B9689 Other specified bacterial agents as the cause of diseases classified elsewhere: Secondary | ICD-10-CM | POA: Diagnosis not present

## 2024-01-18 LAB — POC COVID19/FLU A&B COMBO
Covid Antigen, POC: NEGATIVE
Influenza A Antigen, POC: NEGATIVE
Influenza B Antigen, POC: NEGATIVE

## 2024-01-18 MED ORDER — AMOXICILLIN-POT CLAVULANATE 875-125 MG PO TABS: 1.0000 | ORAL_TABLET | Freq: Two times a day (BID) | ORAL | 0 refills | Status: AC

## 2024-01-18 NOTE — ED Triage Notes (Addendum)
 Pt reports cough x1 week. Pt reports headache and intermittent blurred vision,nausea x2 days. Reports has been taking otc medication with no change in symptoms. Reports is 4 months pregnant, has T1D. Reports CBG is currently 130.   Was exposed to covid last week.

## 2024-01-18 NOTE — ED Provider Notes (Signed)
 [facility]    CSN: [id number] Arrival date & time: 01/18/24  1052      History   Chief Complaint Chief Complaint  Patient presents with   Headache    HPI Leslie Mccarthy is a 23 y.o. female.   Patient presents today with 1 week history of congested and dry cough, stuffy and runny nose, sore throat that is now improved all of the way, headache, sinus pressure, vomiting, decreased appetite, and fatigue.  She also reports 1 episode of blurred vision that occurred this morning when she had the headache.  She reports blurred and lasted for couple of hours.  Denies dizziness or lightheadedness when she had the blurred vision.  She reports history of similar when she was hypoglycemic, but reports she checked her blood sugar at that time and it was in the 120s or 130s.  She has been taking over-the-counter cold and flu medicine that is safe in pregnancy without much benefit from symptoms.  She was exposed to COVID-19 prior to her symptoms starting.  Patient had history of T1DM, wears continuous glucose monitor and insulin pump.  She is also 14 weeks, 5 days pregnant.    Past Medical History:  Diagnosis Date   Diabetes mellitus without complication [facility])     Patient Active Problem List   Diagnosis Date Noted   Pregnancy with mental disorders 12/11/2023   Insulin pump titration 12/11/2023   Pre-existing type 1 diabetes mellitus in pregnancy 12/04/2023   Pregnancy 12/04/2023   Seizures (HCC) 03/13/2023   Hypoglycemia due to type 1 diabetes mellitus (HCC) 08/20/2019   Amenorrhea, secondary 12/18/2018   Constipation in pediatric patient 12/18/2018   Type 1 diabetes mellitus (HCC) 01/05/2017   Hyperglycemia 05/22/2016    History reviewed. No pertinent surgical history.  OB History     Gravida  1   Para      Term      Preterm      AB      Living         SAB      IAB      Ectopic      Multiple      Live Births               Home Medications     Prior to Admission medications   Medication Sig Start Date End Date Taking? Authorizing Provider  amoxicillin-clavulanate (AUGMENTIN) 875-125 MG tablet Take 1 tablet by mouth 2 (two) times daily for 7 days. 01/18/24 01/25/24 Yes Valentino Nose, NP  B-D UF III MINI PEN NEEDLES 31G X 5 MM MISC USE AS DIRECTED TO INJECT INSULIN 6 TIMES DAILY Patient not taking: Reported on 12/11/2023 08/17/22   Gretchen Short, NP  Blood Glucose Monitoring Suppl (ONETOUCH VERIO IQ SYSTEM) w/Device KIT Use to check blood glucose Patient not taking: Reported on 12/11/2023 06/30/16   Dessa Phi, MD  carbamide peroxide (DEBROX) 6.5 % OTIC solution Place 5 drops into both ears 2 (two) times daily. 05/07/23   Particia Nearing, PA-C  Continuous Blood Gluc Receiver (DEXCOM G6 RECEIVER) DEVI 1 Device by Does not apply route as directed. Patient not taking: Reported on 12/11/2023 06/01/20   Gretchen Short, NP  Continuous Glucose Sensor (DEXCOM G6 SENSOR) MISC Change sensor every 10 days 12/05/23   Gretchen Short, NP  Continuous Glucose Transmitter (DEXCOM G6 TRANSMITTER) MISC USE AS DIRECTED FOR 90 DAYS 12/04/23   Gretchen Short, NP  Glucagon (BAQSIMI TWO PACK) 3 MG/DOSE  POWD Place 3 mg into the nose as needed. Patient not taking: Reported on 12/11/2023 06/02/21   Gretchen Short, NP  Insulin Disposable Pump (OMNIPOD 5 DEXG7G6 PODS GEN 5) MISC Change pod every 2 days 01/09/24   Gretchen Short, NP  insulin lispro (HUMALOG KWIKPEN) 100 UNIT/ML KwikPen INJECT UP TO 50 UNITS PER DAY Patient not taking: Reported on 12/11/2023 01/06/23   Gretchen Short, NP  insulin lispro (HUMALOG) 100 UNIT/ML injection INJECT UP TO 200 UNITS INTO INSULIN PUMP EVERY 2-3 DAYS PER PROVIDER GUIDANCE. 01/09/24   Gretchen Short, NP  ONETOUCH VERIO test strip CHECK BLOOD SUGAR 6X DAY (90 DAY SUPPLY) Patient not taking: Reported on 12/11/2023 01/20/20   Gretchen Short, NP    Family History Family History  Problem Relation Age of Onset    Healthy Mother    Healthy Father     Social History Social History   Tobacco Use   Smoking status: Never    Passive exposure: Never   Smokeless tobacco: Never  Vaping Use   Vaping status: Every Day  Substance Use Topics   Alcohol use: No   Drug use: No     Allergies   Patient has no known allergies.   Review of Systems Review of Systems Per HPI  Physical Exam Triage Vital Signs ED Triage Vitals  Encounter Vitals Group     BP 01/18/24 1109 133/84     Systolic BP Percentile --      Diastolic BP Percentile --      Pulse Rate 01/18/24 1109 96     Resp 01/18/24 1109 16     Temp 01/18/24 1109 98 F (36.7 C)     Temp Source 01/18/24 1109 Oral     SpO2 01/18/24 1109 99 %     Weight --      Height --      Head Circumference --      Peak Flow --      Pain Score 01/18/24 1114 7     Pain Loc --      Pain Education --      Exclude from Growth Chart --    No data found.  Updated Vital Signs BP 133/84 (BP Location: Right Arm)   Pulse 96   Temp 98 F (36.7 C) (Oral)   Resp 16   LMP  (LMP Unknown)   SpO2 99%   Visual Acuity Right Eye Distance:   Left Eye Distance:   Bilateral Distance:    Right Eye Near:   Left Eye Near:    Bilateral Near:     Physical Exam Vitals and nursing note reviewed.  Constitutional:      General: She is not in acute distress.    Appearance: Normal appearance. She is not ill-appearing or toxic-appearing.  HENT:     Head: Normocephalic and atraumatic.     Right Ear: Tympanic membrane, ear canal and external ear normal.     Left Ear: Tympanic membrane, ear canal and external ear normal.     Nose: Congestion present. No rhinorrhea.     Right Sinus: Maxillary sinus tenderness and frontal sinus tenderness present.     Left Sinus: Maxillary sinus tenderness and frontal sinus tenderness present.     Mouth/Throat:     Mouth: Mucous membranes are moist.     Pharynx: Oropharynx is clear. Posterior oropharyngeal erythema present. No  oropharyngeal exudate.  Eyes:     General: No scleral icterus.    Extraocular Movements: Extraocular movements  intact.  Cardiovascular:     Rate and Rhythm: Normal rate and regular rhythm.  Pulmonary:     Effort: Pulmonary effort is normal. No respiratory distress.     Breath sounds: Normal breath sounds. No wheezing, rhonchi or rales.  Musculoskeletal:     Cervical back: Normal range of motion and neck supple.  Lymphadenopathy:     Cervical: No cervical adenopathy.  Skin:    General: Skin is warm and dry.     Coloration: Skin is not jaundiced or pale.     Findings: No erythema or rash.  Neurological:     Mental Status: She is alert and oriented to person, place, and time.  Psychiatric:        Behavior: Behavior is cooperative.      UC Treatments / Results  Labs (all labs ordered are listed, but only abnormal results are displayed) Labs Reviewed  POC COVID19/FLU A&B COMBO    EKG   Radiology No results found.  Procedures Procedures (including critical care time)  Medications Ordered in UC Medications - No data to display  Initial Impression / Assessment and Plan / UC Course  I have reviewed the triage vital signs and the nursing notes.  Pertinent labs & imaging results that were available during my care of the patient were reviewed by me and considered in my medical decision making (see chart for details).   Patient is well-appearing, normotensive, afebrile, not tachycardic, not tachypneic, oxygenating well on room air.    1. Acute bacterial sinusitis Suspect viral illness at first, likely COVID-19 and now with signs of bacterial sinus infection Treat with Augmentin twice daily for 7 days Supportive care discussed that is safe in pregnancy ER and return precautions discussed   The patient was given the opportunity to ask questions.  All questions answered to their satisfaction.  The patient is in agreement to this plan.   Final Clinical Impressions(s) / UC  Diagnoses   Final diagnoses:  Acute bacterial sinusitis     Discharge Instructions      We are treating you for sinus infection today with Augmentin twice daily for 7 days.  Take all the medication as prescribed.  OTC Medications safe in pregnancy: - acetaminophen - cepacol throat lozenges - Coricidin HBP: chest congestion/cough, cold/flu - alcohol free cough drops - dextromethorphan for cough - guaifenesin for congestion - Neti pot/saline nasal rinses - Vicks vapo rub     ED Prescriptions     Medication Sig Dispense Auth. Provider   amoxicillin-clavulanate (AUGMENTIN) 875-125 MG tablet Take 1 tablet by mouth 2 (two) times daily for 7 days. 14 tablet Valentino Nose, NP      PDMP not reviewed this encounter.   Valentino Nose, NP 01/18/24 [phone]

## 2024-01-18 NOTE — Discharge Instructions (Signed)
 We are treating you for sinus infection today with Augmentin twice daily for 7 days.  Take all the medication as prescribed.  OTC Medications safe in pregnancy: - acetaminophen - cepacol throat lozenges - Coricidin HBP: chest congestion/cough, cold/flu - alcohol free cough drops - dextromethorphan for cough - guaifenesin for congestion - Neti pot/saline nasal rinses - Vicks vapo rub

## 2024-01-30 ENCOUNTER — Encounter (INDEPENDENT_AMBULATORY_CARE_PROVIDER_SITE_OTHER): Payer: Self-pay

## 2024-02-02 ENCOUNTER — Ambulatory Visit (INDEPENDENT_AMBULATORY_CARE_PROVIDER_SITE_OTHER): Payer: BC Managed Care – PPO | Admitting: Internal Medicine

## 2024-02-02 ENCOUNTER — Encounter: Payer: Self-pay | Admitting: Internal Medicine

## 2024-02-02 VITALS — BP 126/80 | HR 74 | Ht 67.0 in | Wt 182.0 lb

## 2024-02-02 DIAGNOSIS — Z3A16 16 weeks gestation of pregnancy: Secondary | ICD-10-CM

## 2024-02-02 DIAGNOSIS — O24312 Unspecified pre-existing diabetes mellitus in pregnancy, second trimester: Secondary | ICD-10-CM | POA: Diagnosis not present

## 2024-02-02 DIAGNOSIS — E109 Type 1 diabetes mellitus without complications: Secondary | ICD-10-CM

## 2024-02-02 LAB — POCT GLYCOSYLATED HEMOGLOBIN (HGB A1C): Hemoglobin A1C: 6.7 % — AB (ref 4.0–5.6)

## 2024-02-02 NOTE — Patient Instructions (Signed)

## 2024-02-02 NOTE — Progress Notes (Signed)
 Name: Leslie Mccarthy  MRN/ DOB: [id number], [birth year]   Age/ Sex: 23 y.o., female    PCP: Richmond Campbell., PA-C   Reason for Endocrinology Evaluation: Type 1 Diabetes Mellitus     Date of Initial Endocrinology Visit: 02/02/2024     PATIENT IDENTIFIER: Leslie Mccarthy is a 23 y.o. female with a past medical history of DM. The patient presented for initial endocrinology clinic visit on 02/02/2024 for consultative assistance with Leslie Mccarthy diabetes management.    HPI: Leslie Mccarthy    Diagnosed with DM in 2017, patient with elevated GAD-65 at14.1 U/mL , as well as elevated pancreatic islet cell antibodies Currently checking blood sugars multiple  x / day Hypoglycemia episodes : yes               Symptoms: yes                Hemoglobin A1c has ranged from 6.5% in 2024, peaking at 11.9% in 2021.  Leslie Mccarthy is accompanied by Leslie Mccarthy mother today  Leslie Mccarthy is currently pregnant at 16.6 weeks of gestation EDD 07/13/2024  Has occasional nausea and vomiting  Has constipation which is chronic    This patient with type 1 diabetes is treated with Omnipod (insulin pump). During the visit the pump basal and bolus doses were reviewed including carb/insulin rations and supplemental doses. The clinical list was updated. The glucose meter download was reviewed in detail to determine if the current pump settings are providing the best glycemic control without excessive hypoglycemia.  Pump and meter download:    Pump   Omnipod Settings   Insulin type   Humalog   Basal rate       0000 1.4 u/h    0600 1.55 u/h    2300 1.4 u/h       I:C ratio       0000 1:3   1200 1:3   1630 1:3          Sensitivity       0000  20      Goal       0000  110             Type & Model of Pump: Omnipod Insulin Type: Currently using Humalog.  Body mass index is 28.51 kg/m.  PUMP STATISTICS: Unable to download    HOME DIABETES REGIMEN: Humalog  Statin: No ACE-I/ARB: No   CONTINUOUS GLUCOSE  MONITORING RECORD INTERPRETATION: N/A   DIABETIC COMPLICATIONS: Microvascular complications:   Denies: CKD, retinopathy, neuropathy Last eye exam: Completed 2024  Macrovascular complications:   Denies: CAD, PVD, CVA   PAST HISTORY: Past Medical History:  Past Medical History:  Diagnosis Date   Amenorrhea, secondary 12/18/2018   Anxiety    Asthma    Constipation in pediatric patient 12/18/2018   Depression    Diabetes mellitus without complication (HCC)    Past Surgical History: No past surgical history on file.  Social History:  reports that Leslie Mccarthy has never smoked. Leslie Mccarthy has never been exposed to tobacco smoke. Leslie Mccarthy has never used smokeless tobacco. Leslie Mccarthy reports that Leslie Mccarthy does not drink alcohol and does not use drugs. Family History:  Family History  Problem Relation Age of Onset   Healthy Mother    Healthy Father      HOME MEDICATIONS: Allergies as of 02/02/2024   No Known Allergies      Medication List        Accurate as of February 02, 2024  1:13  PM. If you have any questions, ask your nurse or doctor.          B-D UF III MINI PEN NEEDLES 31G X 5 MM Misc Generic drug: Insulin Pen Needle USE AS DIRECTED TO INJECT INSULIN 6 TIMES DAILY   Baqsimi Two Pack 3 MG/DOSE Powd Generic drug: Glucagon Place 3 mg into the nose as needed.   carbamide peroxide 6.5 % OTIC solution Commonly known as: DEBROX Place 5 drops into both ears 2 (two) times daily.   Dexcom G6 Receiver Devi 1 Device by Does not apply route as directed.   Dexcom G6 Sensor Misc Change sensor every 10 days   Dexcom G6 Transmitter Misc USE AS DIRECTED FOR 90 DAYS   insulin lispro 100 UNIT/ML KwikPen Commonly known as: HumaLOG KwikPen INJECT UP TO 50 UNITS PER DAY   insulin lispro 100 UNIT/ML injection Commonly known as: HumaLOG INJECT UP TO 200 UNITS INTO INSULIN PUMP EVERY 2-3 DAYS PER PROVIDER GUIDANCE.   Omnipod 5 DexG7G6 Pods Gen 5 Misc Change pod every 2 days   OneTouch Verio IQ  System w/Device Kit Use to check blood glucose   OneTouch Verio test strip Generic drug: glucose blood CHECK BLOOD SUGAR 6X DAY (90 DAY SUPPLY)         ALLERGIES: No Known Allergies   REVIEW OF SYSTEMS: A comprehensive ROS was conducted with the patient and is negative except as per HPI    OBJECTIVE:   VITAL SIGNS: BP 126/80 (BP Location: Left Arm, Patient Position: Sitting, Cuff Size: Normal)   Pulse 74   Ht 5\' 7"  (1.702 m)   Wt 182 lb (82.6 kg)   LMP 09/20/2023   SpO2 99%   BMI 28.51 kg/m    PHYSICAL EXAM:  General: Pt appears well and is in NAD  Neck: General: Supple without adenopathy or carotid bruits. Thyroid: Thyroid size normal.  No goiter or nodules appreciated.   Lungs: Clear with good BS bilat   Heart: RRR   Abdomen:  soft, nontender  Extremities:  Lower extremities - No pretibial edema.   Neuro: MS is good with appropriate affect, pt is alert and Ox3    DM foot exam: 02/02/2024  The skin of the feet is intact without sores or ulcerations. The pedal pulses are 2+ on right and 2+ on left. The sensation is intact to a screening 5.07, 10 gram monofilament bilaterally   DATA REVIEWED:  Lab Results  Component Value Date   HGBA1C 7.2 (A) 12/11/2023   HGBA1C 6.9 (A) 06/13/2023   HGBA1C 6.5 (A) 03/13/2023    Latest Reference Range & Units 12/11/23 10:13  Sodium 135 - 146 mmol/L 136  Potassium 3.5 - 5.3 mmol/L 4.3  Chloride 98 - 110 mmol/L 102  CO2 20 - 32 mmol/L 24  Glucose 65 - 99 mg/dL 161 (H)  BUN 7 - 25 mg/dL 7  Creatinine 0.96 - 0.45 mg/dL 4.09  Calcium 8.6 - 81.1 mg/dL 8.8  BUN/Creatinine Ratio 6 - 22 (calc) SEE NOTE:  eGFR > OR = 60 mL/min/1.55m2 122  AG Ratio 1.0 - 2.5 (calc) 1.4  AST 10 - 30 U/L 26  ALT 6 - 29 U/L 30 (H)  Total Protein 6.1 - 8.1 g/dL 6.8  Total Bilirubin 0.2 - 1.2 mg/dL 1.0  Total CHOL/HDL Ratio <5.0 (calc) 2.4  Cholesterol <200 mg/dL 914  HDL Cholesterol > OR = 50 mg/dL 66  LDL Cholesterol (Calc) mg/dL (calc) 75   Non-HDL Cholesterol (Calc) <130 mg/dL (  calc) 95  Triglycerides <150 mg/dL 629  (H): Data is abnormally high  ASSESSMENT / PLAN / RECOMMENDATIONS:   1) Type 1 Diabetes Mellitus, Sub-Optimally controlled, Without complications - Most recent A1c of 6.7 %. Goal A1c < 6.5 %.    Plan: GENERAL: I have discussed with the patient the pathophysiology of diabetes. We went over the natural progression of the disease. I explained the complications associated with diabetes including retinopathy, nephropathy, neuropathy as well as increased risk of cardiovascular disease. We went over the benefit seen with glycemic control.   I explained to the patient that diabetic patients are at higher than normal risk for amputations.  We discussed the importance of optimizing glucose control to optimize fetal health Leslie Mccarthy A1c continues to be above goal but is trending down Unfortunately we have not been able to download Leslie Mccarthy Dexcom nor Leslie Mccarthy OmniPod pump as Leslie Mccarthy could not remember Leslie Mccarthy login/password information, I have emphasized the importance of updating this so we are able to download Leslie Mccarthy information in the future Emphasized the importance of continuing to bolus each time Leslie Mccarthy eats Patient states Leslie Mccarthy does not need any refills at this time, as Leslie Mccarthy just received 90-day supply, I did explain to the patient that Leslie Mccarthy would need to contact us for refills otherwise the prescription refill will go to to the previous prescriber  We reviewed pregnancy glucose goals TIMING  BLOOD SUGAR GOALS  FASTING (before breakfast)  60 - 95  BEFORE MEALS LESS THAN 100  2 hours AFTER MEAL LESS THAN 120     MEDICATIONS: Humalog  EDUCATION / INSTRUCTIONS: BG monitoring instructions: Patient is instructed to check Leslie Mccarthy blood sugars before each meal and 2 hours after the meal Call [facility] if: BG persistently < 60  I reviewed the Rule of 15 for the treatment of hypoglycemia in detail with the patient. Literature  supplied.   2) Diabetic complications:  Eye: Does not have known diabetic retinopathy.  Neuro/ Feet: Does not have known diabetic peripheral neuropathy. Renal: Patient does not have known baseline CKD. Leslie Mccarthy is not on an ACEI/ARB at present.   Follow-up in 1 month  Signed electronically by: Lyndle Herrlich, MD  [facility]  [facility] [address] [place] [place], [place] [address] Phone: [phone] FAX: [phone]   CC: Gala Lewandowsky [address] [place] [address] Phone: [phone]  Fax: [phone]    Return to Endocrinology clinic as below: Future Appointments  Date Time Provider Department Center  02/02/2024  1:20 PM Courtland Coppa, Konrad Dolores, MD [facility] None  02/07/2024  7:00 AM [facility] PROVIDER 1 [facility]  02/07/2024  7:30 AM [facility] US6 [facility]

## 2024-02-03 LAB — MICROALBUMIN / CREATININE URINE RATIO
Creatinine, Urine: 38 mg/dL (ref 20–275)
Microalb, Ur: <0.2 mg/dL

## 2024-02-05 ENCOUNTER — Ambulatory Visit: Attending: Obstetrics and Gynecology

## 2024-02-05 ENCOUNTER — Encounter: Payer: Self-pay | Admitting: Internal Medicine

## 2024-02-07 ENCOUNTER — Ambulatory Visit: Attending: Student

## 2024-02-07 ENCOUNTER — Other Ambulatory Visit: Payer: Self-pay

## 2024-02-07 ENCOUNTER — Ambulatory Visit: Admitting: Obstetrics and Gynecology

## 2024-02-07 VITALS — BP 106/70 | HR 70

## 2024-02-07 DIAGNOSIS — E109 Type 1 diabetes mellitus without complications: Secondary | ICD-10-CM

## 2024-02-07 DIAGNOSIS — Z794 Long term (current) use of insulin: Secondary | ICD-10-CM | POA: Diagnosis not present

## 2024-02-07 DIAGNOSIS — O24012 Pre-existing diabetes mellitus, type 1, in pregnancy, second trimester: Secondary | ICD-10-CM | POA: Diagnosis not present

## 2024-02-07 DIAGNOSIS — Z3A17 17 weeks gestation of pregnancy: Secondary | ICD-10-CM

## 2024-02-07 DIAGNOSIS — O24011 Pre-existing diabetes mellitus, type 1, in pregnancy, first trimester: Secondary | ICD-10-CM | POA: Insufficient documentation

## 2024-02-07 DIAGNOSIS — O24312 Unspecified pre-existing diabetes mellitus in pregnancy, second trimester: Secondary | ICD-10-CM

## 2024-02-07 NOTE — Progress Notes (Signed)
  Maternal-Fetal Medicine Consultation Name: Leslie Mccarthy MRN: [id number]  G1 P0 at 17w 4d gestation.  Patient is here for ultrasound evaluation and consultation.  She was accompanied by her mother. She has type 1 diabetes diagnosed at age 23.  She is being followed by her endocrinologist.  Patient is on Omni Pod insulin pump and Dexcom.  She reports her diabetes better controlled.  Her hemoglobin A1c decreased from 7.2 to 6.7%. Patient does not have nephropathy or neuropathy.  She had ophthalmology examination last year that showed no evidence of proliferative retinopathy. She had diabetic ketoacidosis twice in the past the most recent 1 in 2023 when insulin pump failed. She does not have hypertension or any other chronic medical conditions.  Patient reports her EDD is 07/13/2024 that is consistent with 8-week ultrasound (report renal ultrasound mentions as clinically EDD).  She had opted not to screen for fetal aneuploidies.  She declined MSAFP screening.  Ultrasound We performed fetal anatomy scan. Fetal biometry is consistent with her previously-established dates. Amniotic fluid is normal and good fetal activity is seen.  Detailed fetal anatomical survey was not performed because of early gestational age.    Our concerns include Type 1 diabetes in pregnancy I discussed possible complications of diabetes in pregnancy.  Hemoglobin A1c of 7.2% at conception is associated with a low incidence of fetal congenital malformations.  However, the risk is slightly increased to 3% (4 to 2% background rate).  Diabetes associated congenital malformations include cardiac, skeletal and central venous system malformations.  I recommended MSAFP screening to identify the risk for open spina bifida.  Patient agreed to undergo screening.  Poorly controlled diabetes can lead to fetal macrosomia, shoulder dystocia, neurological injuries at birth and stillbirth.  Neonatal complications include respiratory  distress, prolonged NICU admissions.  Gestational hypertension/preeclampsia has increased type of diabetes.  Patient is not taking low-dose aspirin.  I discussed the benefit of low-dose aspirin prophylaxis delays or prevents preeclampsia.  DKA can occur at lower blood glucose values in pregnancy.  Some trigger factors include pump failure and infections.  DKA pregnancy is associated with increased preterm and maternal mortality.  I discussed ultrasound protocol of monitoring pregnancy is complicated by diabetes.  I discussed the importance of weekly antenatal testing from [redacted] weeks gestation until delivery.    Timing of delivery: In well-controlled diabetes, patient can be delivered at [redacted] weeks gestation.  If control is suboptimal, delivery should be considered at 36- or 38-weeks' gestation  I reassured the patient that vaginal delivery can be safely attempted.  I recommended fetal echocardiography to rule out cardiac anomalies.  Recommendations - Low-dose aspirin (81 mg) daily till delivery. - MSAFP screening.  - An appointment was made for her to return 4 weeks for detailed fetal anatomical survey. - We have requested an appointment for fetal echocardiography (6 months) with pediatric cardiology [facility]. - Fetal growth assessments every 4 weeks. - Weekly BPP from [redacted] weeks gestation until delivery. - Continue follow-up with her endocrinologist for insulin pump management.  Consultation including face-to-face (more than 50%) counseling 45 minutes.

## 2024-02-12 ENCOUNTER — Encounter (INDEPENDENT_AMBULATORY_CARE_PROVIDER_SITE_OTHER): Payer: Self-pay

## 2024-03-04 ENCOUNTER — Other Ambulatory Visit: Payer: Self-pay

## 2024-03-04 DIAGNOSIS — Z419 Encounter for procedure for purposes other than remedying health state, unspecified: Secondary | ICD-10-CM | POA: Diagnosis not present

## 2024-03-04 DIAGNOSIS — E109 Type 1 diabetes mellitus without complications: Secondary | ICD-10-CM

## 2024-03-04 MED ORDER — DEXCOM G6 SENSOR MISC
1 refills | Status: DC
Start: 2024-03-04 — End: 2024-04-02

## 2024-03-04 MED ORDER — DEXCOM G6 TRANSMITTER MISC: 3 refills | Status: DC

## 2024-03-06 ENCOUNTER — Other Ambulatory Visit: Payer: Self-pay | Admitting: *Deleted

## 2024-03-06 ENCOUNTER — Ambulatory Visit

## 2024-03-06 ENCOUNTER — Ambulatory Visit: Attending: Obstetrics and Gynecology | Admitting: Obstetrics

## 2024-03-06 VITALS — BP 115/65 | HR 77

## 2024-03-06 DIAGNOSIS — Z3A21 21 weeks gestation of pregnancy: Secondary | ICD-10-CM | POA: Diagnosis not present

## 2024-03-06 DIAGNOSIS — Z794 Long term (current) use of insulin: Secondary | ICD-10-CM | POA: Diagnosis not present

## 2024-03-06 DIAGNOSIS — O24012 Pre-existing diabetes mellitus, type 1, in pregnancy, second trimester: Secondary | ICD-10-CM

## 2024-03-06 DIAGNOSIS — O24112 Pre-existing diabetes mellitus, type 2, in pregnancy, second trimester: Secondary | ICD-10-CM | POA: Diagnosis not present

## 2024-03-06 DIAGNOSIS — E109 Type 1 diabetes mellitus without complications: Secondary | ICD-10-CM | POA: Diagnosis not present

## 2024-03-06 DIAGNOSIS — Z369 Encounter for antenatal screening, unspecified: Secondary | ICD-10-CM | POA: Insufficient documentation

## 2024-03-06 DIAGNOSIS — Z9641 Presence of insulin pump (external) (internal): Secondary | ICD-10-CM | POA: Diagnosis not present

## 2024-03-06 DIAGNOSIS — O24312 Unspecified pre-existing diabetes mellitus in pregnancy, second trimester: Secondary | ICD-10-CM

## 2024-03-14 DIAGNOSIS — O24912 Unspecified diabetes mellitus in pregnancy, second trimester: Secondary | ICD-10-CM | POA: Diagnosis not present

## 2024-03-15 NOTE — Progress Notes (Signed)
 After review, MFM consult with provider is not indicated for today  [name], MD 03/15/2024 10:59 AM  [facility]

## 2024-04-02 ENCOUNTER — Other Ambulatory Visit: Payer: Self-pay

## 2024-04-02 DIAGNOSIS — E109 Type 1 diabetes mellitus without complications: Secondary | ICD-10-CM

## 2024-04-02 MED ORDER — DEXCOM G6 SENSOR MISC: 1 refills | Status: DC

## 2024-04-03 ENCOUNTER — Ambulatory Visit: Admitting: Obstetrics

## 2024-04-03 ENCOUNTER — Ambulatory Visit: Attending: Obstetrics and Gynecology

## 2024-04-03 ENCOUNTER — Other Ambulatory Visit: Payer: Self-pay | Admitting: *Deleted

## 2024-04-03 VITALS — BP 119/80 | HR 75

## 2024-04-03 DIAGNOSIS — Z794 Long term (current) use of insulin: Secondary | ICD-10-CM

## 2024-04-03 DIAGNOSIS — E119 Type 2 diabetes mellitus without complications: Secondary | ICD-10-CM | POA: Diagnosis not present

## 2024-04-03 DIAGNOSIS — O24112 Pre-existing diabetes mellitus, type 2, in pregnancy, second trimester: Secondary | ICD-10-CM

## 2024-04-03 DIAGNOSIS — O24012 Pre-existing diabetes mellitus, type 1, in pregnancy, second trimester: Secondary | ICD-10-CM

## 2024-04-03 DIAGNOSIS — Z3A25 25 weeks gestation of pregnancy: Secondary | ICD-10-CM | POA: Diagnosis not present

## 2024-04-03 DIAGNOSIS — E109 Type 1 diabetes mellitus without complications: Secondary | ICD-10-CM | POA: Diagnosis not present

## 2024-04-03 NOTE — Progress Notes (Signed)
 MFM Consult Note  Leslie Mccarthy is currently at 25 weeks and 4 days.  She has been followed due to pregestational diabetes that is treated with an insulin  pump.  She reports that her blood sugar values have been improving.    She had a normal fetal echocardiogram performed with [facility] pediatric cardiology.  On today's exam, the overall EFW of 1 pound 13 ounces measures at the 34th percentile for her gestational age.    There was normal amniotic fluid noted.  Due to pregestational diabetes treated with insulin , we will continue to follow her with growth ultrasounds throughout her pregnancy.    Weekly fetal testing will be started at around 32 weeks.  Delivery for well-controlled diabetes in pregnancy is usually recommended at around 39 weeks.    Delivery at 37 weeks may be considered should her glycemic control be poor.  She will return in 5 weeks for another growth scan.    The patient stated that all of her questions were answered today.  A total of 20 minutes was spent counseling and coordinating the care for this patient.  Greater than 50% of the time was spent in direct face-to-face contact.

## 2024-04-04 ENCOUNTER — Telehealth: Payer: Self-pay | Admitting: Pharmacy Technician

## 2024-04-04 ENCOUNTER — Other Ambulatory Visit (HOSPITAL_COMMUNITY): Payer: Self-pay

## 2024-04-04 DIAGNOSIS — Z419 Encounter for procedure for purposes other than remedying health state, unspecified: Secondary | ICD-10-CM | POA: Diagnosis not present

## 2024-04-04 NOTE — Telephone Encounter (Signed)
 Pharmacy Patient Advocate Encounter  Received notification from [facility] Medicaid that Prior Authorization for Dexcom G6 Sensor has been APPROVED from 03/21/2024 to 04/04/2025   PA #/Case ID/Reference #: [id number]

## 2024-04-04 NOTE — Telephone Encounter (Signed)
 Pharmacy Patient Advocate Encounter   Received notification from CoverMyMeds that prior authorization for Dexcom G6 Sensor is required/requested.   Insurance verification completed.   The patient is insured through [facility] [place] .   Per test claim: PA required; PA submitted to above mentioned insurance via CoverMyMeds Key/confirmation #/EOC [facility] Status is pending

## 2024-04-15 ENCOUNTER — Encounter: Payer: Self-pay | Admitting: Internal Medicine

## 2024-04-15 ENCOUNTER — Ambulatory Visit (INDEPENDENT_AMBULATORY_CARE_PROVIDER_SITE_OTHER): Admitting: Internal Medicine

## 2024-04-15 VITALS — BP 130/80 | HR 80 | Ht 67.0 in | Wt 200.8 lb

## 2024-04-15 DIAGNOSIS — Z3A27 27 weeks gestation of pregnancy: Secondary | ICD-10-CM

## 2024-04-15 DIAGNOSIS — E109 Type 1 diabetes mellitus without complications: Secondary | ICD-10-CM

## 2024-04-15 DIAGNOSIS — O24312 Unspecified pre-existing diabetes mellitus in pregnancy, second trimester: Secondary | ICD-10-CM

## 2024-04-15 LAB — POCT GLYCOSYLATED HEMOGLOBIN (HGB A1C): Hemoglobin A1C: 6.1 % — AB (ref 4.0–5.6)

## 2024-04-15 NOTE — Progress Notes (Unsigned)
 Name: Leslie Mccarthy  MRN/ DOB: [id number], 06-09-2001   Age/ Sex: 23 y.o., female    PCP: Debrah Josette ORN., PA-C   Reason for Endocrinology Evaluation: Type 1 Diabetes Mellitus     Date of Initial Endocrinology Visit: 02/02/2024    PATIENT IDENTIFIER: [name] is a 23 y.o. female with a past medical history of DM. The patient presented for initial endocrinology clinic visit on 02/02/2024  for consultative assistance with her diabetes management.    HPI: Ms. Mccarthy    Diagnosed with DM in 2017, patient with elevated GAD-65 at14.1 U/mL , as well as elevated pancreatic islet cell antibodies             Hemoglobin A1c has ranged from 6.5% in 2024, peaking at 11.9% in 2021.  On her initial visit to our clinic she had an A1c of 6.7%, she was [redacted] weeks pregnant.    SUBJECTIVE:   During the last visit (02/02/2024): A1c 6.7%  Today (04/15/24): Leslie Mccarthy is here for follow-up on diabetes management.  She checks blood sugars multiple times daily. The patient has  had hypoglycemic episodes since the last clinic visit. The patient is  symptomatic with these episodes.   She is currently pregnant at 27.2 weeks of gestation EDD 07/13/2024- girl   Has indigestion but no nausea or vomiting  Continues with  chronic constipation- on fiber and stool softners    This patient with type 1 diabetes is treated with Omnipod (insulin  pump). During the visit the pump basal and bolus doses were reviewed including carb/insulin  rations and supplemental doses. The clinical list was updated. The glucose meter download was reviewed in detail to determine if the current pump settings are providing the best glycemic control without excessive hypoglycemia.  Pump and meter download:    Pump   Omnipod Settings   Insulin  type   Humalog    Basal rate       0000 1.3 u/h    0600 1.4 u/h    2300 1.3u/h       I:C ratio       0000 1:3   1200 1:3   1630 1:3          Sensitivity        0000  20      Goal       0000  110             Type & Model of Pump: Omnipod Insulin  Type: Currently using Humalog .  Body mass index is 31.45 kg/m.  PUMP STATISTICS: Unable to download    HOME DIABETES REGIMEN: Humalog   Statin: No ACE-I/ARB: No  CONTINUOUS GLUCOSE MONITORING RECORD INTERPRETATION    Dates of Recording: 6/10 - 04/15/2024  Sensor description: Dexcom  Results statistics:   CGM use % of time 88  Average and SD 151/48  Time in range 73   %  % Time Above 180 22  % Time above 250 4  % Time Below target 1   Glycemic patterns summary: BG's trend down overnight and fluctuate during the day  Hyperglycemic episodes postprandial  Hypoglycemic episodes occurred after bolus  Overnight periods: Optimal    DIABETIC COMPLICATIONS: Microvascular complications:   Denies: CKD, retinopathy, neuropathy Last eye exam: Completed 2024  Macrovascular complications:   Denies: CAD, PVD, CVA   PAST HISTORY: Past Medical History:  Past Medical History:  Diagnosis Date   Amenorrhea, secondary 12/18/2018   Anxiety    Asthma  Constipation in pediatric patient 12/18/2018   Depression    Diabetes mellitus without complication [facility])    Past Surgical History:  Past Surgical History:  Procedure Laterality Date   NO PAST SURGERIES      Social History:  reports that she has never smoked. She has never been exposed to tobacco smoke. She has never used smokeless tobacco. She reports that she does not currently use alcohol. She reports that she does not use drugs. Family History:  Family History  Problem Relation Age of Onset   Healthy Mother    Healthy Father    Hypertension Maternal Grandmother    Diabetes Maternal Grandmother    Asthma Maternal Grandmother    Myasthenia gravis Maternal Grandmother    Thyroid  disease Maternal Grandmother    Kidney disease Maternal Grandfather    COPD Maternal Grandfather    Heart disease Maternal Grandfather     Hypertension Maternal Grandfather    Hypertension Paternal Grandmother    Cancer Paternal Grandmother    Diabetes Paternal Grandmother    Hypertension Paternal Grandfather    Diabetes Paternal Grandfather    Diabetes Other    Stroke Neg Hx      HOME MEDICATIONS: Allergies as of 04/15/2024   No Known Allergies      Medication List        Accurate as of April 15, 2024  2:36 PM. If you have any questions, ask your nurse or doctor.          B-D UF III MINI PEN NEEDLES 31G X 5 MM Misc Generic drug: Insulin  Pen Needle USE AS DIRECTED TO INJECT INSULIN  6 TIMES DAILY   Baqsimi  Two Pack 3 MG/DOSE Powd Generic drug: Glucagon  Place 3 mg into the nose as needed.   Dexcom G6 Receiver Devi 1 Device by Does not apply route as directed.   Dexcom G6 Sensor Misc Change sensor every 10 days   Dexcom G6 Transmitter Misc USE AS DIRECTED FOR 90 DAYS   insulin  lispro 100 UNIT/ML KwikPen Commonly known as: HumaLOG  KwikPen INJECT UP TO 50 UNITS PER DAY   insulin  lispro 100 UNIT/ML injection Commonly known as: HumaLOG  INJECT UP TO 200 UNITS INTO INSULIN  PUMP EVERY 2-3 DAYS PER PROVIDER GUIDANCE.   Omnipod 5 DexG7G6 Pods Gen 5 Misc Change pod every 2 days   OneTouch Verio IQ System w/Device Kit Use to check blood glucose   OneTouch Verio test strip Generic drug: glucose blood CHECK BLOOD SUGAR 6X DAY (90 DAY SUPPLY)   PRENATAL VITAMIN PO         ALLERGIES: No Known Allergies   REVIEW OF SYSTEMS: A comprehensive ROS was conducted with the patient and is negative except as per HPI    OBJECTIVE:   VITAL SIGNS: BP 130/80 (BP Location: Left Arm, Patient Position: Sitting, Cuff Size: Normal)   Pulse 80   Ht 5' 7 (1.702 m)   Wt 200 lb 12.8 oz (91.1 kg)   LMP 09/20/2023   SpO2 99%   BMI 31.45 kg/m    PHYSICAL EXAM:  General: Pt appears well and is in NAD  Neck: General: Supple without adenopathy or carotid bruits. Thyroid : Thyroid  size normal.  No goiter or  nodules appreciated.   Lungs: Clear with good BS bilat   Heart: RRR   Abdomen:  soft, nontender  Extremities:  Lower extremities - No pretibial edema.   Neuro: MS is good with appropriate affect, pt is alert and Ox3    DM foot exam:  02/02/2024  The skin of the feet is intact without sores or ulcerations. The pedal pulses are 2+ on right and 2+ on left. The sensation is intact to a screening 5.07, 10 gram monofilament bilaterally   DATA REVIEWED:  Lab Results  Component Value Date   HGBA1C 6.1 (A) 04/15/2024   HGBA1C 6.7 (A) 02/02/2024   HGBA1C 7.2 (A) 12/11/2023    Latest Reference Range & Units 12/11/23 10:13  Sodium 135 - 146 mmol/L 136  Potassium 3.5 - 5.3 mmol/L 4.3  Chloride 98 - 110 mmol/L 102  CO2 20 - 32 mmol/L 24  Glucose 65 - 99 mg/dL 879 (H)  BUN 7 - 25 mg/dL 7  Creatinine 9.49 - 9.03 mg/dL 9.27  Calcium 8.6 - 89.7 mg/dL 8.8  BUN/Creatinine Ratio 6 - 22 (calc) SEE NOTE:  eGFR > OR = 60 mL/min/1.47m2 122  AG Ratio 1.0 - 2.5 (calc) 1.4  AST 10 - 30 U/L 26  ALT 6 - 29 U/L 30 (H)  Total Protein 6.1 - 8.1 g/dL 6.8  Total Bilirubin 0.2 - 1.2 mg/dL 1.0  Total CHOL/HDL Ratio <5.0 (calc) 2.4  Cholesterol <200 mg/dL 838  HDL Cholesterol > OR = 50 mg/dL 66  LDL Cholesterol (Calc) mg/dL (calc) 75  Non-HDL Cholesterol (Calc) <130 mg/dL (calc) 95  Triglycerides <150 mg/dL 881  (H): Data is abnormally high  ASSESSMENT / PLAN / RECOMMENDATIONS:   1) Type 1 Diabetes Mellitus,Optimally controlled, Without complications - Most recent A1c of 6.1 %. Goal A1c < 6.5 %.    Unfortunately we have not been able to download her Dexcom nor her OmniPod pump as she could not remember her login/password information, I have emphasized the importance of updating this so we are able to download her information in the future Emphasized the importance of continuing to bolus each time she eats Patient states she does not need any refills at this time, as she just received 90-day supply, I  did explain to the patient that she would need to contact us  for refills otherwise the prescription refill will go to to the previous prescriber  We reviewed pregnancy glucose goals TIMING  BLOOD SUGAR GOALS  FASTING (before breakfast)  60 - 95  BEFORE MEALS LESS THAN 100  2 hours AFTER MEAL LESS THAN 120     Pump   Omnipod Settings   Insulin  type   Humalog    Basal rate       0000 1.3 u/h    0600 1.45 u/h    2300 1.35 u/h       I:C ratio       0000 1:3   1200 1:3   1630 1:3          Sensitivity       0000  20      Goal       0000  110              MEDICATIONS: Humalog   EDUCATION / INSTRUCTIONS: BG monitoring instructions: Patient is instructed to check her blood sugars before each meal and 2 hours after the meal Call [facility] if: BG persistently < 60  I reviewed the Rule of 15 for the treatment of hypoglycemia in detail with the patient. Literature supplied.   2) Diabetic complications:  Eye: Does not have known diabetic retinopathy.  Neuro/ Feet: Does not have known diabetic peripheral neuropathy. Renal: Patient does not have known baseline CKD. She is not on an ACEI/ARB at present.  Follow-up in 1 month  Signed electronically by: Stefano Redgie Butts, MD  [facility] Endocrinology  [facility] [address] [place], [place] [address] Phone: [phone] FAX: [phone]   CC: Debrah Josette MICAEL DEVONNA [address] [place] [address] Phone: [phone]  Fax: [phone]    Return to Endocrinology clinic as below: Future Appointments  Date Time Provider Department Center  05/08/2024  3:00 PM [facility] PROVIDER 1 [facility]  05/08/2024  3:30 PM [facility] US3 [facility]  05/22/2024 11:00 AM [facility] PROVIDER 1 [facility]  05/22/2024 11:30 AM [facility] US3 [facility]  06/03/2024 11:00 AM [facility] PROVIDER 1 [facility]  06/03/2024 11:30 AM [facility] US2 [facility]

## 2024-04-15 NOTE — Patient Instructions (Signed)

## 2024-04-16 ENCOUNTER — Encounter: Payer: Self-pay | Admitting: Internal Medicine

## 2024-04-16 DIAGNOSIS — Z1331 Encounter for screening for depression: Secondary | ICD-10-CM | POA: Diagnosis not present

## 2024-04-16 DIAGNOSIS — Z23 Encounter for immunization: Secondary | ICD-10-CM | POA: Diagnosis not present

## 2024-04-16 DIAGNOSIS — Z3689 Encounter for other specified antenatal screening: Secondary | ICD-10-CM | POA: Diagnosis not present

## 2024-05-01 ENCOUNTER — Ambulatory Visit

## 2024-05-04 DIAGNOSIS — Z419 Encounter for procedure for purposes other than remedying health state, unspecified: Secondary | ICD-10-CM | POA: Diagnosis not present

## 2024-05-08 ENCOUNTER — Other Ambulatory Visit: Payer: Self-pay | Admitting: *Deleted

## 2024-05-08 ENCOUNTER — Ambulatory Visit

## 2024-05-08 ENCOUNTER — Ambulatory Visit: Attending: Obstetrics and Gynecology | Admitting: Obstetrics

## 2024-05-08 VITALS — BP 121/75 | HR 93

## 2024-05-08 DIAGNOSIS — Z9641 Presence of insulin pump (external) (internal): Secondary | ICD-10-CM | POA: Insufficient documentation

## 2024-05-08 DIAGNOSIS — Z794 Long term (current) use of insulin: Secondary | ICD-10-CM | POA: Diagnosis not present

## 2024-05-08 DIAGNOSIS — Z3A30 30 weeks gestation of pregnancy: Secondary | ICD-10-CM | POA: Diagnosis not present

## 2024-05-08 DIAGNOSIS — O24013 Pre-existing diabetes mellitus, type 1, in pregnancy, third trimester: Secondary | ICD-10-CM

## 2024-05-08 DIAGNOSIS — O24313 Unspecified pre-existing diabetes mellitus in pregnancy, third trimester: Secondary | ICD-10-CM

## 2024-05-08 DIAGNOSIS — Z362 Encounter for other antenatal screening follow-up: Secondary | ICD-10-CM | POA: Insufficient documentation

## 2024-05-08 DIAGNOSIS — O24012 Pre-existing diabetes mellitus, type 1, in pregnancy, second trimester: Secondary | ICD-10-CM

## 2024-05-08 NOTE — Progress Notes (Signed)
 MFM Consult Note  Leslie Mccarthy is currently at 30 weeks and 4 days.  She has been followed due to pregestational diabetes that is treated with an insulin  pump.  She reports that her blood sugar values have mostly been within normal limits.  She denies any problems since her last exam.  On today's exam, the overall EFW of 3 pound 12 ounces measures at the 55th percentile for her gestational age.    There was normal amniotic fluid noted with a total AFI 15.05 cm.  Due to pregestational diabetes treated with insulin , weekly fetal testing should be started at around 32 weeks.    The patient reports that she will have these tests scheduled in your office.  She should continue the weekly fetal testing in your office until delivery.  A follow-up growth scan was scheduled in our office in 6 weeks.  Due to pregestational diabetes, delivery may be considered at between 37 to 38 weeks.  The patient stated that all of her questions were answered today.  A total of 20 minutes was spent counseling and coordinating the care for this patient.  Greater than 50% of the time was spent in direct face-to-face contact.

## 2024-05-14 DIAGNOSIS — O24019 Pre-existing diabetes mellitus, type 1, in pregnancy, unspecified trimester: Secondary | ICD-10-CM | POA: Diagnosis not present

## 2024-05-14 DIAGNOSIS — Z3A31 31 weeks gestation of pregnancy: Secondary | ICD-10-CM | POA: Diagnosis not present

## 2024-05-14 DIAGNOSIS — O24012 Pre-existing diabetes mellitus, type 1, in pregnancy, second trimester: Secondary | ICD-10-CM | POA: Diagnosis not present

## 2024-05-15 ENCOUNTER — Ambulatory Visit: Admitting: Internal Medicine

## 2024-05-15 NOTE — Progress Notes (Deleted)
 Name: Leslie Mccarthy  MRN/ DOB: [id number], 2001/02/11   Age/ Sex: 23 y.o., female    PCP: Debrah Josette ORN., PA-C   Reason for Endocrinology Evaluation: Type 1 Diabetes Mellitus     Date of Initial Endocrinology Visit: 02/02/2024    PATIENT IDENTIFIER: Leslie Mccarthy is a 23 y.o. female with a past medical history of DM. The patient presented for initial endocrinology clinic visit on 02/02/2024  for consultative assistance with her diabetes management.    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  On her initial visit to our clinic she had an A1c of 6.7%, she was [redacted] weeks pregnant.    SUBJECTIVE:   During the last visit (04/15/2024): A1c 6.1%  Today (05/15/24): Leslie Mccarthy is here for follow-up on diabetes management.  She checks blood sugars multiple times daily. The patient has  had hypoglycemic episodes since the last clinic visit. The patient is  symptomatic with these episodes.   She is currently pregnant at 31.4 weeks of gestation EDD 07/13/2024- girl   Has indigestion but no nausea or vomiting  Continues with  chronic constipation- on fiber and stool softners    This patient with type 1 diabetes is treated with Omnipod (insulin  pump). During the visit the pump basal and bolus doses were reviewed including carb/insulin  rations and supplemental doses. The clinical list was updated. The glucose meter download was reviewed in detail to determine if the current pump settings are providing the best glycemic control without excessive hypoglycemia.  Pump and meter download:    Pump   Omnipod Settings   Insulin  type   Humalog    Basal rate       0000 1.3 u/h    0600 1.45 u/h    2300 1.35 u/h       I:C ratio       0000 1:3   1200 1:3   1630 1:3          Sensitivity        0000  20      Goal       0000  110             Type & Model of Pump: Omnipod Insulin  Type: Currently using Humalog .  There is no height or weight on file to calculate BMI.  PUMP STATISTICS: Unable to download    HOME DIABETES REGIMEN: Humalog   Statin: No ACE-I/ARB: No  CONTINUOUS GLUCOSE MONITORING RECORD INTERPRETATION    Dates of Recording: 6/10 - 04/15/2024  Sensor description: Dexcom  Results statistics:   CGM use % of time 88  Average and SD 151/48  Time in range 73   %  % Time Above 180 22  % Time above 250 4  % Time Below target 1   Glycemic patterns summary: BG's trend down overnight and fluctuate during the day  Hyperglycemic episodes postprandial  Hypoglycemic episodes occurred after bolus  Overnight periods: Optimal    DIABETIC COMPLICATIONS: Microvascular complications:   Denies: CKD, retinopathy, neuropathy Last eye exam: Completed 2024  Macrovascular complications:   Denies: CAD, PVD, CVA   PAST HISTORY: Past Medical History:  Past Medical History:  Diagnosis Date   Amenorrhea, secondary 12/18/2018   Anxiety  Asthma    Constipation in pediatric patient 12/18/2018   Depression    Diabetes mellitus without complication [facility])    Past Surgical History:  Past Surgical History:  Procedure Laterality Date   NO PAST SURGERIES      Social History:  reports that she has never smoked. She has never been exposed to tobacco smoke. She has never used smokeless tobacco. She reports that she does not currently use alcohol. She reports that she does not use drugs. Family History:  Family History  Problem Relation Age of Onset   Healthy Mother    Healthy Father    Hypertension Maternal Grandmother    Diabetes Maternal Grandmother    Asthma Maternal Grandmother    Myasthenia gravis Maternal Grandmother    Thyroid  disease Maternal Grandmother    Kidney disease Maternal Grandfather    COPD Maternal Grandfather    Heart disease  Maternal Grandfather    Hypertension Maternal Grandfather    Hypertension Paternal Grandmother    Cancer Paternal Grandmother    Diabetes Paternal Grandmother    Hypertension Paternal Grandfather    Diabetes Paternal Grandfather    Diabetes Other    Stroke Neg Hx      HOME MEDICATIONS: Allergies as of 05/15/2024   No Known Allergies      Medication List        Accurate as of May 15, 2024  7:24 AM. If you have any questions, ask your nurse or doctor.          B-D UF III MINI PEN NEEDLES 31G X 5 MM Misc Generic drug: Insulin  Pen Needle USE AS DIRECTED TO INJECT INSULIN  6 TIMES DAILY   Baqsimi  Two Pack 3 MG/DOSE Powd Generic drug: Glucagon  Place 3 mg into the nose as needed.   Dexcom G6 Receiver Devi 1 Device by Does not apply route as directed.   Dexcom G6 Sensor Misc Change sensor every 10 days   Dexcom G6 Transmitter Misc USE AS DIRECTED FOR 90 DAYS   insulin  lispro 100 UNIT/ML KwikPen Commonly known as: HumaLOG  KwikPen INJECT UP TO 50 UNITS PER DAY   insulin  lispro 100 UNIT/ML injection Commonly known as: HumaLOG  INJECT UP TO 200 UNITS INTO INSULIN  PUMP EVERY 2-3 DAYS PER PROVIDER GUIDANCE.   Omnipod 5 DexG7G6 Pods Gen 5 Misc Change pod every 2 days   OneTouch Verio IQ System w/Device Kit Use to check blood glucose   OneTouch Verio test strip Generic drug: glucose blood CHECK BLOOD SUGAR 6X DAY (90 DAY SUPPLY)   PRENATAL VITAMIN PO         ALLERGIES: No Known Allergies   REVIEW OF SYSTEMS: A comprehensive ROS was conducted with the patient and is negative except as per HPI    OBJECTIVE:   VITAL SIGNS: LMP 09/20/2023    PHYSICAL EXAM:  General: Pt appears well and is in NAD  Neck: General: Supple without adenopathy or carotid bruits. Thyroid : Thyroid  size normal.  No goiter or nodules appreciated.   Lungs: Clear with good BS bilat   Heart: RRR   Abdomen:  soft, nontender  Extremities:  Lower extremities - No pretibial edema.    Neuro: MS is good with appropriate affect, pt is alert and Ox3    DM foot exam: 02/02/2024  The skin of the feet is intact without sores or ulcerations. The pedal pulses are 2+ on right and 2+ on left. The sensation is intact to a screening 5.07, 10 gram monofilament bilaterally   DATA  REVIEWED:  Lab Results  Component Value Date   HGBA1C 6.1 (A) 04/15/2024   HGBA1C 6.7 (A) 02/02/2024   HGBA1C 7.2 (A) 12/11/2023    Latest Reference Range & Units 12/11/23 10:13  Sodium 135 - 146 mmol/L 136  Potassium 3.5 - 5.3 mmol/L 4.3  Chloride 98 - 110 mmol/L 102  CO2 20 - 32 mmol/L 24  Glucose 65 - 99 mg/dL 879 (H)  BUN 7 - 25 mg/dL 7  Creatinine 9.49 - 9.03 mg/dL 9.27  Calcium 8.6 - 89.7 mg/dL 8.8  BUN/Creatinine Ratio 6 - 22 (calc) SEE NOTE:  eGFR > OR = 60 mL/min/1.35m2 122  AG Ratio 1.0 - 2.5 (calc) 1.4  AST 10 - 30 U/L 26  ALT 6 - 29 U/L 30 (H)  Total Protein 6.1 - 8.1 g/dL 6.8  Total Bilirubin 0.2 - 1.2 mg/dL 1.0  Total CHOL/HDL Ratio <5.0 (calc) 2.4  Cholesterol <200 mg/dL 838  HDL Cholesterol > OR = 50 mg/dL 66  LDL Cholesterol (Calc) mg/dL (calc) 75  Non-HDL Cholesterol (Calc) <130 mg/dL (calc) 95  Triglycerides <150 mg/dL 881  (H): Data is abnormally high  ASSESSMENT / PLAN / RECOMMENDATIONS:   1) Type 1 Diabetes Mellitus,Optimally controlled, Without complications - Most recent A1c of 6.1 %. Goal A1c < 6.5 %.    Unfortunately we have not been able to download OmniPod pump again today, I have been able to manually review the pump settings, the patient has been noted with frequent boluses , sometimes 20 minutes apart, which results in insulin  stacking and hypoglycemia .  She also tends to guesstimate on her carbohydrate intake  Emphasized the importance of trying to count carbohydrates as accurately as possible to prevent hypo and hyperglycemia I will increase her basal rate during the times below  We reviewed pregnancy glucose goals TIMING  BLOOD SUGAR GOALS  FASTING  (before breakfast)  60 - 95  BEFORE MEALS LESS THAN 100  2 hours AFTER MEAL LESS THAN 120     Pump   Omnipod Settings   Insulin  type   Humalog    Basal rate       0000 1.3 u/h    0600 1.45 u/h    2300 1.35 u/h       I:C ratio       0000 1:3   1200 1:3   1630 1:3          Sensitivity       0000  20      Goal       0000  110              MEDICATIONS: Humalog   EDUCATION / INSTRUCTIONS: BG monitoring instructions: Patient is instructed to check her blood sugars before each meal and 2 hours after the meal Call [facility] if: BG persistently < 60  I reviewed the Rule of 15 for the treatment of hypoglycemia in detail with the patient. Literature supplied.   2) Diabetic complications:  Eye: Does not have known diabetic retinopathy.  Neuro/ Feet: Does not have known diabetic peripheral neuropathy. Renal: Patient does not have known baseline CKD. She is not on an ACEI/ARB at present.   Follow-up in 1 month  Signed electronically by: Stefano Redgie Butts, MD  [facility] Endocrinology  [facility] [address] [place] [place], [place] [address] Phone: [phone] FAX: [phone]   CC: Debrah Josette MICAEL DEVONNA [address] [place] [address] Phone: [phone]  Fax:  [phone]    Return to Endocrinology clinic as below: Future Appointments  Date Time Provider Department Center  05/15/2024  8:30 AM Brennyn Ortlieb, Donell Cardinal, MD [facility] None  06/20/2024 11:15 AM [facility] PROVIDER 1 [facility]  06/20/2024 11:30 AM [facility] US5 [facility]

## 2024-05-22 ENCOUNTER — Other Ambulatory Visit

## 2024-05-22 DIAGNOSIS — Z3A32 32 weeks gestation of pregnancy: Secondary | ICD-10-CM | POA: Diagnosis not present

## 2024-05-22 DIAGNOSIS — O24013 Pre-existing diabetes mellitus, type 1, in pregnancy, third trimester: Secondary | ICD-10-CM | POA: Diagnosis not present

## 2024-05-25 ENCOUNTER — Encounter: Payer: Self-pay | Admitting: Internal Medicine

## 2024-05-27 ENCOUNTER — Other Ambulatory Visit: Payer: Self-pay

## 2024-05-27 DIAGNOSIS — E109 Type 1 diabetes mellitus without complications: Secondary | ICD-10-CM

## 2024-05-27 MED ORDER — OMNIPOD 5 DEXG7G6 PODS GEN 5 MISC: 1 refills | Status: DC

## 2024-05-27 MED ORDER — INSULIN LISPRO 100 UNIT/ML IJ SOLN: INTRAMUSCULAR | 1 refills | Status: AC

## 2024-06-03 ENCOUNTER — Ambulatory Visit

## 2024-06-04 DIAGNOSIS — Z419 Encounter for procedure for purposes other than remedying health state, unspecified: Secondary | ICD-10-CM | POA: Diagnosis not present

## 2024-06-04 DIAGNOSIS — O24013 Pre-existing diabetes mellitus, type 1, in pregnancy, third trimester: Secondary | ICD-10-CM | POA: Diagnosis not present

## 2024-06-04 DIAGNOSIS — O24019 Pre-existing diabetes mellitus, type 1, in pregnancy, unspecified trimester: Secondary | ICD-10-CM | POA: Diagnosis not present

## 2024-06-04 DIAGNOSIS — Z3A34 34 weeks gestation of pregnancy: Secondary | ICD-10-CM | POA: Diagnosis not present

## 2024-06-05 ENCOUNTER — Ambulatory Visit: Admitting: Internal Medicine

## 2024-06-05 ENCOUNTER — Encounter: Payer: Self-pay | Admitting: Internal Medicine

## 2024-06-05 VITALS — BP 112/70 | HR 93 | Ht 67.0 in | Wt 213.0 lb

## 2024-06-05 DIAGNOSIS — O24313 Unspecified pre-existing diabetes mellitus in pregnancy, third trimester: Secondary | ICD-10-CM

## 2024-06-05 DIAGNOSIS — Z3A16 16 weeks gestation of pregnancy: Secondary | ICD-10-CM | POA: Diagnosis not present

## 2024-06-05 DIAGNOSIS — E109 Type 1 diabetes mellitus without complications: Secondary | ICD-10-CM

## 2024-06-05 LAB — POCT GLYCOSYLATED HEMOGLOBIN (HGB A1C): Hemoglobin A1C: 6.3 % — AB (ref 4.0–5.6)

## 2024-06-05 NOTE — Patient Instructions (Addendum)
 Please change the settings of the pump as below after Delivery:      Pump   Omnipod Settings        Basal rate       0000 0.625 u/h    0600 0.725 u/h    2300 0.675 u/h       I:C ratio       0000 1:6                  Sensitivity       0000  40      Goal       0000  110

## 2024-06-05 NOTE — Progress Notes (Signed)
 Name: Leslie Mccarthy  MRN/ DOB: [id number], 2001-08-29   Age/ Sex: 23 y.o., female    PCP: Debrah Josette ORN., PA-C   Reason for Endocrinology Evaluation: Type 1 Diabetes Mellitus     Date of Initial Endocrinology Visit: 02/02/2024    PATIENT IDENTIFIER: Leslie Mccarthy is a 23 y.o. female with a past medical history of DM. The patient presented for initial endocrinology clinic visit on 02/02/2024  for consultative assistance with her diabetes management.    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  On her initial visit to our clinic she had an A1c of 6.7%, she was [redacted] weeks pregnant.    SUBJECTIVE:   During the last visit (04/15/2024): A1c 6.1%    Today (06/05/24): Leslie Mccarthy is here for follow-up on diabetes management.  She checks blood sugars multiple times daily. The patient has  had hypoglycemic episodes since the last clinic visit. The patient is  symptomatic with these episodes.   She is currently pregnant at 34.4 weeks of gestation Scheduled for induction 8/31st - girl   [facility]up [facility] [place] Continues with  chronic constipation- on fiber and stool softners  Had sever indigestion , takes pepcid      This patient with type 1 diabetes is treated with Omnipod (insulin  pump). During the visit the pump basal and bolus doses were reviewed including carb/insulin  rations and supplemental doses. The clinical list was updated. The glucose meter download was reviewed in detail to determine if the current pump settings are providing the best glycemic control without excessive hypoglycemia.  Pump and meter download:    Pump   Omnipod Settings   Insulin  type   Humalog    Basal rate       0000 1.3 u/h    0600 1.45 u/h    2300 1.35 u/h       I:C ratio        0000 1:3   1200 1:3   1630 1:3          Sensitivity       0000  20      Goal       0000  110             Type & Model of Pump: Omnipod Insulin  Type: Currently using Humalog .  Body mass index is 33.36 kg/m.   PUMP STATISTICS: Average BG: 159  Average Daily Carbs (g): 180.5 Average Total Daily Insulin : 96.2  Average Daily Basal: 39.4 (41 %) Average Daily Bolus: 56.8 (59 %)   HOME DIABETES REGIMEN: Humalog     Statin: No ACE-I/ARB: No  CONTINUOUS GLUCOSE MONITORING RECORD INTERPRETATION    Dates of Recording: 7/31-8/13/2025  Sensor description: Dexcom  Results statistics:   CGM use % of time 87.5  Average and SD 159/56  Time in range 68 %  % Time Above 180 22  % Time above 250 8  % Time Below target 1   Glycemic patterns summary: BGs are optimal throughout the night and between the meals  Hyperglycemic episodes postprandial  Hypoglycemic episodes occurred at night  Overnight periods: Optimal    DIABETIC COMPLICATIONS: Microvascular complications:   Denies: CKD, retinopathy, neuropathy Last eye exam: Completed 2024  Macrovascular complications:   Denies: CAD, PVD, CVA   PAST HISTORY: Past Medical History:  Past Medical History:  Diagnosis Date   Amenorrhea, secondary 12/18/2018   Anxiety    Asthma    Constipation in pediatric patient 12/18/2018   Depression    Diabetes mellitus without complication [facility])    Past Surgical History:  Past Surgical History:  Procedure Laterality Date   NO PAST SURGERIES      Social History:  reports that she has never smoked. She has never been exposed to tobacco smoke. She has never used smokeless tobacco. She reports that she does not currently use alcohol. She reports that she does not use drugs. Family History:  Family History  Problem Relation Age of Onset   Healthy Mother    Healthy Father    Hypertension Maternal Grandmother    Diabetes Maternal Grandmother    Asthma Maternal  Grandmother    Myasthenia gravis Maternal Grandmother    Thyroid  disease Maternal Grandmother    Kidney disease Maternal Grandfather    COPD Maternal Grandfather    Heart disease Maternal Grandfather    Hypertension Maternal Grandfather    Hypertension Paternal Grandmother    Cancer Paternal Grandmother    Diabetes Paternal Grandmother    Hypertension Paternal Grandfather    Diabetes Paternal Grandfather    Diabetes Other    Stroke Neg Hx      HOME MEDICATIONS: Allergies as of 06/05/2024   No Known Allergies      Medication List        Accurate as of June 05, 2024 11:25 AM. If you have any questions, ask your nurse or doctor.          B-D UF III MINI PEN NEEDLES 31G X 5 MM Misc Generic drug: Insulin  Pen Needle USE AS DIRECTED TO INJECT INSULIN  6 TIMES DAILY   Baqsimi  Two Pack 3 MG/DOSE Powd Generic drug: Glucagon  Place 3 mg into the nose as needed.   Dexcom G6 Receiver Devi 1 Device by Does not apply route as directed.   Dexcom G6 Sensor Misc Change sensor every 10 days   Dexcom G6 Transmitter Misc USE AS DIRECTED FOR 90 DAYS   insulin  lispro 100 UNIT/ML KwikPen Commonly known as: HumaLOG  KwikPen INJECT UP TO 50 UNITS PER DAY   insulin  lispro 100 UNIT/ML injection Commonly known as: HumaLOG  INJECT UP TO 200 UNITS INTO INSULIN  PUMP EVERY 2-3 DAYS PER PROVIDER GUIDANCE.   Omnipod 5 DexG7G6 Pods Gen 5 Misc Change pod every 2 days   OneTouch Verio IQ System w/Device Kit Use to check blood glucose   OneTouch Verio test strip Generic drug: glucose blood CHECK BLOOD SUGAR 6X DAY (90 DAY SUPPLY)   PRENATAL VITAMIN PO         ALLERGIES: No Known Allergies   REVIEW OF SYSTEMS: A comprehensive ROS was conducted with the patient and is negative except as per HPI    OBJECTIVE:   VITAL SIGNS: BP 112/70 (BP Location: Left Arm, Patient Position: Sitting, Cuff Size: Normal)   Pulse 93   Ht 5' 7 (1.702 m)   Wt 213 lb (96.6 kg)   LMP 09/20/2023    SpO2 99%   BMI 33.36 kg/m    PHYSICAL EXAM:  General: Pt appears well and is in NAD  Neck: General: Supple without adenopathy or carotid bruits. Thyroid : Thyroid  size normal.  No goiter or nodules appreciated.   Lungs: Clear with good BS bilat   Heart: RRR  Abdomen:  soft, nontender  Extremities:  Lower extremities - No pretibial edema.   Neuro: MS is good with appropriate affect, pt is alert and Ox3    DM foot exam: 02/02/2024  The skin of the feet is intact without sores or ulcerations. The pedal pulses are 2+ on right and 2+ on left. The sensation is intact to a screening 5.07, 10 gram monofilament bilaterally   DATA REVIEWED:  Lab Results  Component Value Date   HGBA1C 6.3 (A) 06/05/2024   HGBA1C 6.1 (A) 04/15/2024   HGBA1C 6.7 (A) 02/02/2024    Latest Reference Range & Units 12/11/23 10:13  Sodium 135 - 146 mmol/L 136  Potassium 3.5 - 5.3 mmol/L 4.3  Chloride 98 - 110 mmol/L 102  CO2 20 - 32 mmol/L 24  Glucose 65 - 99 mg/dL 879 (H)  BUN 7 - 25 mg/dL 7  Creatinine 9.49 - 9.03 mg/dL 9.27  Calcium 8.6 - 89.7 mg/dL 8.8  BUN/Creatinine Ratio 6 - 22 (calc) SEE NOTE:  eGFR > OR = 60 mL/min/1.32m2 122  AG Ratio 1.0 - 2.5 (calc) 1.4  AST 10 - 30 U/L 26  ALT 6 - 29 U/L 30 (H)  Total Protein 6.1 - 8.1 g/dL 6.8  Total Bilirubin 0.2 - 1.2 mg/dL 1.0  Total CHOL/HDL Ratio <5.0 (calc) 2.4  Cholesterol <200 mg/dL 838  HDL Cholesterol > OR = 50 mg/dL 66  LDL Cholesterol (Calc) mg/dL (calc) 75  Non-HDL Cholesterol (Calc) <130 mg/dL (calc) 95  Triglycerides <150 mg/dL 881  (H): Data is abnormally high  ASSESSMENT / PLAN / RECOMMENDATIONS:   1) Type 1 Diabetes Mellitus,Optimally controlled, Without complications - Most recent A1c of 6.1 %. Goal A1c < 6.5 %.    - A1c at goal -I have reviewed pump/CGM download, patient has been noted with hypoglycemia overnight, will decrease basal insulin  at midnight -Patient continues with postprandial hyperglycemia, she assures me  proper carbohydrate counting, she has a very strong insulin  to carb ratio of 1:3 ... I will change that as well -The patient will also be provided with postnatal pump setting Pump   Omnipod Settings   Insulin  type   Humalog    Basal rate       0000 1.25 u/h    0600 1.45 u/h    2300 1.35 u/h       I:C ratio       0000 1:2                  Sensitivity       0000  20      Goal       0000  110          CHANGE PUMP setting after delivery to the following :     Pump   Omnipod Settings        Basal rate       0000 0.625 u/h    0600 0.725 u/h    2300 0.675 u/h       I:C ratio       0000 1:6                  Sensitivity       0000  40      Goal       0000  110           MEDICATIONS: Humalog   EDUCATION / INSTRUCTIONS: BG monitoring instructions: Patient is instructed to check her blood sugars before each  meal and 2 hours after the meal Call [facility] if: BG persistently < 60  I reviewed the Rule of 15 for the treatment of hypoglycemia in detail with the patient. Literature supplied.   2) Diabetic complications:  Eye: Does not have known diabetic retinopathy.  Neuro/ Feet: Does not have known diabetic peripheral neuropathy. Renal: Patient does not have known baseline CKD. She is not on an ACEI/ARB at present.   Follow-up in 5 weeks   I spent 25 minutes preparing to see the patient by review of recent labs, imaging and procedures, obtaining and reviewing separately obtained history, communicating with the patient, ordering medications, tests or procedures, and documenting clinical information in the EHR including the differential Dx, treatment, and any further evaluation and other management    Signed electronically by: Stefano Redgie Butts, MD  [facility] Endocrinology  [facility] [address] [place], [place] [address] Phone: [phone] FAX: [phone]   CC: Debrah Josette Leslie Mccarthy [address]  [place] [address] Phone: [phone]  Fax: [phone]    Return to Endocrinology clinic as below: Future Appointments  Date Time Provider Department Center  06/20/2024 11:15 AM [facility] PROVIDER 1 [facility]  06/20/2024 11:30 AM [facility] US5 [facility]  06/23/2024  6:30 AM [facility] SCHED ROOM [facility] None

## 2024-06-06 ENCOUNTER — Encounter: Payer: Self-pay | Admitting: Internal Medicine

## 2024-06-13 ENCOUNTER — Telehealth: Payer: Self-pay

## 2024-06-13 DIAGNOSIS — Z113 Encounter for screening for infections with a predominantly sexual mode of transmission: Secondary | ICD-10-CM | POA: Diagnosis not present

## 2024-06-13 DIAGNOSIS — Z3685 Encounter for antenatal screening for Streptococcus B: Secondary | ICD-10-CM | POA: Diagnosis not present

## 2024-06-13 DIAGNOSIS — O24319 Unspecified pre-existing diabetes mellitus in pregnancy, unspecified trimester: Secondary | ICD-10-CM | POA: Diagnosis not present

## 2024-06-13 DIAGNOSIS — Z3A35 35 weeks gestation of pregnancy: Secondary | ICD-10-CM | POA: Diagnosis not present

## 2024-06-13 DIAGNOSIS — O24019 Pre-existing diabetes mellitus, type 1, in pregnancy, unspecified trimester: Secondary | ICD-10-CM | POA: Diagnosis not present

## 2024-06-13 NOTE — Telephone Encounter (Signed)
 Patient's OBGYN called requested to speak with MD regarding the care of their mutual patient. Message sent to MD.

## 2024-06-14 ENCOUNTER — Encounter: Payer: Self-pay | Admitting: Radiology

## 2024-06-14 DIAGNOSIS — Z3685 Encounter for antenatal screening for Streptococcus B: Secondary | ICD-10-CM | POA: Diagnosis not present

## 2024-06-14 NOTE — Telephone Encounter (Signed)
 I have called Dr. Larraine Sharps back on 06/14/2024 at 6:50 AM   We discussed postprandial hyperglycemia, I did express my concern regarding the patient underestimating carbohydrate content, I did offer the patient proper carbohydrate training but she assures me she is counting correctly, insulin  to carb ratio is 1:2 which is very tight for T1DM   Dr. Sharps and I have agreed that this is the best that the patient will get a controlling postprandial hyperglycemia, and most likely will be induced soon.   Leslie Redgie Butts, MD  [facility] Endocrinology  [facility] [address] [place], [place] [address] Phone: [phone] FAX: [phone]

## 2024-06-18 ENCOUNTER — Encounter: Payer: Self-pay | Admitting: Internal Medicine

## 2024-06-18 DIAGNOSIS — O0990 Supervision of high risk pregnancy, unspecified, unspecified trimester: Secondary | ICD-10-CM | POA: Diagnosis not present

## 2024-06-18 DIAGNOSIS — Z3A36 36 weeks gestation of pregnancy: Secondary | ICD-10-CM | POA: Diagnosis not present

## 2024-06-18 DIAGNOSIS — O24319 Unspecified pre-existing diabetes mellitus in pregnancy, unspecified trimester: Secondary | ICD-10-CM | POA: Diagnosis not present

## 2024-06-19 ENCOUNTER — Other Ambulatory Visit: Payer: Self-pay

## 2024-06-19 ENCOUNTER — Inpatient Hospital Stay (HOSPITAL_COMMUNITY): Admission: RE | Admit: 2024-06-19 | Source: Ambulatory Visit

## 2024-06-19 DIAGNOSIS — E109 Type 1 diabetes mellitus without complications: Secondary | ICD-10-CM

## 2024-06-19 MED ORDER — DEXCOM G6 SENSOR MISC: 1 refills | Status: DC

## 2024-06-20 ENCOUNTER — Inpatient Hospital Stay (HOSPITAL_COMMUNITY): Admitting: Anesthesiology

## 2024-06-20 ENCOUNTER — Other Ambulatory Visit: Payer: Self-pay

## 2024-06-20 ENCOUNTER — Encounter (HOSPITAL_COMMUNITY): Payer: Self-pay | Admitting: Student

## 2024-06-20 ENCOUNTER — Inpatient Hospital Stay (HOSPITAL_COMMUNITY)
Admission: AD | Admit: 2024-06-20 | Discharge: 2024-06-23 | DRG: 807 | Disposition: A | Discharge status: Home / Self Care | Attending: Obstetrics and Gynecology | Admitting: Obstetrics and Gynecology

## 2024-06-20 ENCOUNTER — Ambulatory Visit

## 2024-06-20 DIAGNOSIS — Z9641 Presence of insulin pump (external) (internal): Secondary | ICD-10-CM | POA: Diagnosis present

## 2024-06-20 DIAGNOSIS — O9952 Diseases of the respiratory system complicating childbirth: Secondary | ICD-10-CM | POA: Diagnosis not present

## 2024-06-20 DIAGNOSIS — J45909 Unspecified asthma, uncomplicated: Secondary | ICD-10-CM | POA: Diagnosis not present

## 2024-06-20 DIAGNOSIS — Z794 Long term (current) use of insulin: Secondary | ICD-10-CM | POA: Diagnosis not present

## 2024-06-20 DIAGNOSIS — O2402 Pre-existing diabetes mellitus, type 1, in childbirth: Principal | ICD-10-CM | POA: Diagnosis present

## 2024-06-20 DIAGNOSIS — Z3A36 36 weeks gestation of pregnancy: Secondary | ICD-10-CM | POA: Diagnosis not present

## 2024-06-20 DIAGNOSIS — O24313 Unspecified pre-existing diabetes mellitus in pregnancy, third trimester: Principal | ICD-10-CM | POA: Diagnosis present

## 2024-06-20 DIAGNOSIS — O24013 Pre-existing diabetes mellitus, type 1, in pregnancy, third trimester: Principal | ICD-10-CM

## 2024-06-20 DIAGNOSIS — Z8249 Family history of ischemic heart disease and other diseases of the circulatory system: Secondary | ICD-10-CM

## 2024-06-20 DIAGNOSIS — Z833 Family history of diabetes mellitus: Secondary | ICD-10-CM

## 2024-06-20 DIAGNOSIS — E1065 Type 1 diabetes mellitus with hyperglycemia: Secondary | ICD-10-CM | POA: Diagnosis present

## 2024-06-20 LAB — GLUCOSE, CAPILLARY
Glucose-Capillary: 124 mg/dL — ABNORMAL HIGH (ref 70–99)
Glucose-Capillary: 125 mg/dL — ABNORMAL HIGH (ref 70–99)
Glucose-Capillary: 126 mg/dL — ABNORMAL HIGH (ref 70–99)
Glucose-Capillary: 96 mg/dL (ref 70–99)
Glucose-Capillary: 83 mg/dL (ref 70–99)

## 2024-06-20 LAB — BASIC METABOLIC PANEL WITH GFR
Anion gap: 13 (ref 5–15)
BUN: 6 mg/dL (ref 6–20)
CO2: 20 mmol/L — ABNORMAL LOW (ref 22–32)
Calcium: 9.8 mg/dL (ref 8.9–10.3)
Chloride: 102 mmol/L (ref 98–111)
Creatinine, Ser: 0.66 mg/dL (ref 0.44–1.00)
GFR, Estimated: >60 mL/min (ref >60)
Glucose, Bld: 129 mg/dL — ABNORMAL HIGH (ref 70–99)
Potassium: 3.5 mmol/L (ref 3.5–5.1)
Sodium: 135 mmol/L (ref 135–145)

## 2024-06-20 LAB — CBC
HCT: 35.4 % — ABNORMAL LOW (ref 36.0–46.0)
Hemoglobin: 12.5 g/dL (ref 12.0–15.0)
MCH: 31.4 pg (ref 26.0–34.0)
MCHC: 35.3 g/dL (ref 30.0–36.0)
MCV: 88.9 fL (ref 80.0–100.0)
Platelets: 178 K/uL (ref 150–400)
RBC: 3.98 MIL/uL (ref 3.87–5.11)
RDW: 11.9 % (ref 11.5–15.5)
WBC: 12.1 K/uL — ABNORMAL HIGH (ref 4.0–10.5)
nRBC: 0 % (ref 0.0–0.2)

## 2024-06-20 LAB — RPR: RPR Ser Ql: NONREACTIVE

## 2024-06-20 LAB — TYPE AND SCREEN
ABO/RH(D): A POS
Antibody Screen: NEGATIVE

## 2024-06-20 MED ORDER — MISOPROSTOL 25 MCG QUARTER TABLET
25.0000 ug | ORAL_TABLET | ORAL | Status: DC | PRN
  Administered 2024-06-20: 25 ug via VAGINAL
  Filled 2024-06-20: qty 1

## 2024-06-20 MED ORDER — EPHEDRINE 5 MG/ML INJ
10.0000 mg | INTRAVENOUS | Status: DC | PRN
Start: 2024-06-20 — End: 2024-06-21

## 2024-06-20 MED ORDER — SOD CITRATE-CITRIC ACID 500-334 MG/5ML PO SOLN: 30.0000 mL | ORAL | Status: DC | PRN

## 2024-06-20 MED ORDER — LACTATED RINGERS IV SOLN: 500.0000 mL | INTRAVENOUS | Status: DC | PRN

## 2024-06-20 MED ORDER — TERBUTALINE SULFATE 1 MG/ML IJ SOLN: 0.2500 mg | Freq: Once | INTRAMUSCULAR | Status: DC | PRN

## 2024-06-20 MED ORDER — LACTATED RINGERS IV SOLN
500.0000 mL | Freq: Once | INTRAVENOUS | Status: AC
  Administered 2024-06-20: 500 mL via INTRAVENOUS

## 2024-06-20 MED ORDER — PHENYLEPHRINE 80 MCG/ML (10ML) SYRINGE FOR IV PUSH (FOR BLOOD PRESSURE SUPPORT): 80.0000 ug | PREFILLED_SYRINGE | INTRAVENOUS | Status: DC | PRN

## 2024-06-20 MED ORDER — DIPHENHYDRAMINE HCL 50 MG/ML IJ SOLN: 12.5000 mg | INTRAMUSCULAR | Status: DC | PRN

## 2024-06-20 MED ORDER — FENTANYL-BUPIVACAINE-NACL 0.5-0.125-0.9 MG/250ML-% EP SOLN
12.0000 mL/h | EPIDURAL | Status: DC | PRN
  Administered 2024-06-20: 12 mL/h via EPIDURAL
  Filled 2024-06-20: qty 250

## 2024-06-20 MED ORDER — LIDOCAINE HCL (PF) 1 % IJ SOLN
INTRAMUSCULAR | Status: DC | PRN
Start: 2024-06-20 — End: 2024-06-21
  Administered 2024-06-20: 8 mL via EPIDURAL

## 2024-06-20 MED ORDER — MISOPROSTOL 50MCG HALF TABLET
50.0000 ug | ORAL_TABLET | Freq: Once | ORAL | Status: AC
  Administered 2024-06-20: 50 ug via ORAL
  Filled 2024-06-20: qty 1

## 2024-06-20 MED ORDER — OXYCODONE-ACETAMINOPHEN 5-325 MG PO TABS: 2.0000 | ORAL_TABLET | ORAL | Status: DC | PRN

## 2024-06-20 MED ORDER — LACTATED RINGERS IV SOLN: INTRAVENOUS | Status: DC

## 2024-06-20 MED ORDER — INSULIN REGULAR(HUMAN) IN NACL 100-0.9 UT/100ML-% IV SOLN: INTRAVENOUS | Status: DC

## 2024-06-20 MED ORDER — EPHEDRINE 5 MG/ML INJ: 10.0000 mg | INTRAVENOUS | Status: DC | PRN

## 2024-06-20 MED ORDER — ONDANSETRON HCL 4 MG/2ML IJ SOLN
4.0000 mg | Freq: Four times a day (QID) | INTRAMUSCULAR | Status: DC | PRN
  Administered 2024-06-21: 4 mg via INTRAVENOUS
  Filled 2024-06-20: qty 2

## 2024-06-20 MED ORDER — OXYTOCIN BOLUS FROM INFUSION
333.0000 mL | Freq: Once | INTRAVENOUS | Status: AC
  Administered 2024-06-21: 333 mL via INTRAVENOUS

## 2024-06-20 MED ORDER — DEXTROSE IN LACTATED RINGERS 5 % IV SOLN: INTRAVENOUS | Status: DC

## 2024-06-20 MED ORDER — FENTANYL CITRATE (PF) 100 MCG/2ML IJ SOLN: 50.0000 ug | INTRAMUSCULAR | Status: DC | PRN

## 2024-06-20 MED ORDER — OXYTOCIN-SODIUM CHLORIDE 30-0.9 UT/500ML-% IV SOLN
1.0000 m[IU]/min | INTRAVENOUS | Status: DC
  Administered 2024-06-20: 2 m[IU]/min via INTRAVENOUS

## 2024-06-20 MED ORDER — DEXTROSE 50 % IV SOLN: 0.0000 mL | INTRAVENOUS | Status: DC | PRN

## 2024-06-20 MED ORDER — OXYCODONE-ACETAMINOPHEN 5-325 MG PO TABS: 1.0000 | ORAL_TABLET | ORAL | Status: DC | PRN

## 2024-06-20 MED ORDER — ACETAMINOPHEN 325 MG PO TABS: 650.0000 mg | ORAL_TABLET | ORAL | Status: DC | PRN

## 2024-06-20 MED ORDER — OXYTOCIN-SODIUM CHLORIDE 30-0.9 UT/500ML-% IV SOLN
2.5000 [IU]/h | INTRAVENOUS | Status: DC
  Administered 2024-06-21: 2.5 [IU]/h via INTRAVENOUS
  Filled 2024-06-20: qty 500

## 2024-06-20 MED ORDER — LIDOCAINE HCL (PF) 1 % IJ SOLN: 30.0000 mL | INTRAMUSCULAR | Status: DC | PRN

## 2024-06-20 NOTE — Progress Notes (Signed)
 Patient ID: Leslie Mccarthy, female   DOB: 12-21-2000, 23 y.o.   MRN: [id number] Pt with no complaints. Rare contraction  Of note she received first dose of vaginal cytotec  ( ) at 730 am and I placed a foley catheter fto aid ripening at 9am with 60cc saline  Catheter still in place and rare ctx noted Cat 1 strip   Plan to administer 50mcg cytotec  orally now   Continue with expectant mgmt

## 2024-06-20 NOTE — H&P (Signed)
 Leslie Mccarthy is a 23 y.o. prime  female presenting at 36 4/7wks for medical IOL due to uncontrolled T1DM. Pt followed by Dr Verdon ( endocrine) and used omnipod pump and dexcom for mgmt. HgA1C 7.1 at start of pregnancy. Endo with concern about poor control thus after discussion with MFM decision made for IOL at 36 weeks.  Pregnancy also complicated by: 1.Mental disorder - no meds  2. Asthma - albuterol prn  3. VZNI She is GBS negative. Declined elective testing  OB History     Gravida  1   Para      Term      Preterm      AB      Living         SAB      IAB      Ectopic      Multiple      Live Births             Past Medical History:  Diagnosis Date   Amenorrhea, secondary 12/18/2018   Anxiety    Asthma    Constipation in pediatric patient 12/18/2018   Depression    Diabetes mellitus without complication (HCC)    Past Surgical History:  Procedure Laterality Date   NO PAST SURGERIES     Family History: family history includes Asthma in her maternal grandmother; COPD in her maternal grandfather; Cancer in her paternal grandmother; Diabetes in her maternal grandmother, paternal grandfather, paternal grandmother, and another family member; Healthy in her father and mother; Heart disease in her maternal grandfather; Hypertension in her maternal grandfather, maternal grandmother, paternal grandfather, and paternal grandmother; Kidney disease in her maternal grandfather; Myasthenia gravis in her maternal grandmother; Thyroid  disease in her maternal grandmother. Social History:  reports that she has never smoked. She has never been exposed to tobacco smoke. She has never used smokeless tobacco. She reports that she does not currently use alcohol. She reports that she does not use drugs.     Maternal Diabetes: Yes:  Diabetes Type:  Pre-pregnancy, Insulin /Medication controlled Genetic Screening: Declined Maternal Ultrasounds/Referrals: Normal Fetal Ultrasounds or  other Referrals:  Fetal echo, Referred to Materal Fetal Medicine  Maternal Substance Abuse:  No Significant Maternal Medications:  None Significant Maternal Lab Results:  Group B Strep negative Number of Prenatal Visits:greater than 3 verified prenatal visits Maternal Vaccinations:TDap and Flu Other Comments:  None  Review of Systems  Constitutional:  Negative for activity change and fatigue.  Eyes:  Negative for photophobia and visual disturbance.  Respiratory:  Negative for chest tightness and shortness of breath.   Cardiovascular:  Positive for leg swelling. Negative for chest pain and palpitations.  Gastrointestinal:  Negative for abdominal pain.  Genitourinary:  Negative for pelvic pain.  Psychiatric/Behavioral:  The patient is not nervous/anxious.    Maternal Medical History:  Reason for admission: IOL - medical  Contractions: Frequency: rare.   Perceived severity is mild.   Fetal activity: Perceived fetal activity is normal.   Prenatal complications: no prenatal complications Prenatal Complications - Diabetes: type 1. Diabetes is managed by insulin  pump.     Dilation: 1 Effacement (%): 50 Station: -2 Exam by:: m wilkins rnc Blood pressure 127/87, pulse 90, temperature 98.3 F (36.8 C), temperature source Oral, resp. rate 16, height 5' 7 (1.702 m), weight 98.5 kg, last menstrual period 09/20/2023. Maternal Exam:  Uterine Assessment: Contraction strength is mild.  Contraction frequency is rare.  Abdomen: Patient reports generalized tenderness.  Estimated fetal weight is  AGA.   Fetal presentation: vertex Introitus: Normal vulva. Vulva is negative for condylomata.  Normal vagina.  Vagina is negative for condylomata.  Cervix: Cervix evaluated by digital exam.     Fetal Exam Fetal Monitor Review: Baseline rate: 130.  Variability: moderate (6-25 bpm).   Pattern: accelerations present and no decelerations.   Fetal State Assessment: Category I - tracings are  normal.   Physical Exam Constitutional:      Appearance: Normal appearance. She is obese.  Cardiovascular:     Rate and Rhythm: Normal rate.     Pulses: Normal pulses.  Pulmonary:     Effort: Pulmonary effort is normal.  Abdominal:     Palpations: Abdomen is soft.     Tenderness: There is generalized abdominal tenderness.  Genitourinary:    General: Normal vulva.  Musculoskeletal:        General: Normal range of motion.     Cervical back: Normal range of motion.  Skin:    General: Skin is warm and dry.     Capillary Refill: Capillary refill takes 2 to 3 seconds.  Neurological:     General: No focal deficit present.     Mental Status: She is alert and oriented to person, place, and time. Mental status is at baseline.  Psychiatric:        Mood and Affect: Mood normal.        Behavior: Behavior normal.        Thought Content: Thought content normal.        Judgment: Judgment normal.     Prenatal labs: ABO, Rh: --/--/PENDING (08/28 9345) Antibody: PENDING (08/28 0654) Rubella: Immune (02/10 0000) RPR: Nonreactive (02/10 0000)  HBsAg: Negative (02/10 0000)  HIV: Non-reactive (02/10 0000)  GBS:     Assessment/Plan: 22yo prime at 36 4/7wks with T1DM - uncontrolled for IOL - Admit  - Cytotec  and foley bulb for ripening ( aware of label indications but will proceed due to personal proven success with treatment)  - Pain control prn  - Monitor FSBS per dexcom - Anticipate svd    Ted ORN Roslyn Else 06/20/2024, 8:30 AM

## 2024-06-20 NOTE — Progress Notes (Signed)
 Leslie Mccarthy updated regarding pt's diabetes. Her dexcom reads 37 points higher than our machine. Will recheck in 1 hour. If needed may replace patient's dexcom.

## 2024-06-20 NOTE — Anesthesia Procedure Notes (Signed)
 Epidural Patient location during procedure: OB Start time: 06/20/2024 4:35 PM End time: 06/20/2024 4:45 PM  Staffing Anesthesiologist: Merla Almarie HERO, DO Performed: anesthesiologist   Preanesthetic Checklist Completed: patient identified, IV checked, risks and benefits discussed, monitors and equipment checked, pre-op evaluation and timeout performed  Epidural Patient position: sitting Prep: DuraPrep and site prepped and draped Patient monitoring: continuous pulse ox, blood pressure, heart rate and cardiac monitor Approach: midline Location: L3-L4 Injection technique: LOR air  Needle:  Needle type: Tuohy  Needle gauge: 17 G Needle length: 9 cm Needle insertion depth: 7 cm Catheter type: closed end flexible Catheter size: 19 Gauge Catheter at skin depth: 12 cm Test dose: negative  Assessment Sensory level: T8 Events: blood not aspirated, no cerebrospinal fluid, injection not painful, no injection resistance, no paresthesia and negative IV test  Additional Notes Patient identified. Risks/Benefits/Options discussed with patient including but not limited to bleeding, infection, nerve damage, paralysis, failed block, incomplete pain control, headache, blood pressure changes, nausea, vomiting, reactions to medication both or allergic, itching and postpartum back pain. Confirmed with bedside nurse the patient's most recent platelet count. Confirmed with patient that they are not currently taking any anticoagulation, have any bleeding history or any family history of bleeding disorders. Patient expressed understanding and wished to proceed. All questions were answered. Sterile technique was used throughout the entire procedure. Please see nursing notes for vital signs. Test dose was given through epidural catheter and negative prior to continuing to dose epidural or start infusion. Warning signs of high block given to the patient including shortness of breath, tingling/numbness in  hands, complete motor block, or any concerning symptoms with instructions to call for help. Patient was given instructions on fall risk and not to get out of bed. All questions and concerns addressed with instructions to call with any issues or inadequate analgesia.  Reason for block:procedure for pain

## 2024-06-20 NOTE — Inpatient Diabetes Management (Signed)
 ADA Standards of Care 2025 Diabetes in Pregnancy Target Glucose Ranges:  Fasting: 70 - 95 mg/dL 1 hr postprandial:  889 - 140mg /dL (from first bite of meal) 2 hr postprandial:  100 - 120 mg/dL (from first bit of meal)    Latest Reference Range & Units 06/20/24 07:05 06/20/24 07:17 06/20/24 07:19  Glucose-Capillary 70 - 99 mg/dL 873 (H) 874 (H) 875 (H)  Pregnancy Insulin  pump settings:  Pump   Omnipod Settings   Insulin  type   Humalog     Basal rate          0000 1.3 u/h     0600 1.45 u/h     2300 1.35 u/h          I:C ratio          0000 1:3    1200 1:3    1630 1:3                Sensitivity          0000  20         Goal          0000  110         Note patient here for IOL. 36 weeks and 4 days.  She states that blood sugars have been fairly well controlled but she has struggled some with PP readings.  She wears Omnipod 5 plus Dexcom G6 sensor.  She has her post-delivery insulin  pump settings in her bag.   Pump   Omnipod Settings   Insulin  type   Humalog     Basal rate          0000 0.625 u/h     0600 0.725 u/h     2300 0.675 u/h          I:C ratio          0000 1:6                      Sensitivity          0000  40         Goal          0000  110    Patient's Dexcom and fingerstick were approximately 25 points different.  Encouraged patient to calibrate Dexcom with next finger stick.  Per CGM policy, there should be no more than a 20% variance between CGM and fingerstick.   Discussed with patient and RN.  Plan is to start IV insulin  as labor progresses.  Patient very prepared and doing a great job!!  Thanks,   Randall Bullocks, RN, BC-ADM Inpatient Diabetes Coordinator Pager [phone]  (8a-5p)

## 2024-06-20 NOTE — Anesthesia Preprocedure Evaluation (Signed)
 Anesthesia Evaluation  Patient identified by MRN, date of birth, ID band Patient awake    Reviewed: Allergy & Precautions, Patient's Chart, lab work & pertinent test results  Airway Mallampati: II  TM Distance: >3 FB Neck ROM: Full    Dental no notable dental hx.    Pulmonary neg pulmonary ROS   Pulmonary exam normal breath sounds clear to auscultation       Cardiovascular negative cardio ROS Normal cardiovascular exam Rhythm:Regular Rate:Normal     Neuro/Psych  PSYCHIATRIC DISORDERS Anxiety Depression    negative neurological ROS     GI/Hepatic negative GI ROS, Neg liver ROS,,,  Endo/Other  diabetes, Well Controlled, Type 1, Insulin  Dependent  BMI 34  Renal/GU negative Renal ROS  negative genitourinary   Musculoskeletal negative musculoskeletal ROS (+)    Abdominal  (+) + obese  Peds negative pediatric ROS (+)  Hematology negative hematology ROS (+) Hb 12.5, plt 178   Anesthesia Other Findings   Reproductive/Obstetrics (+) Pregnancy 36 week IOL for T1DM                              Anesthesia Physical Anesthesia Plan  ASA: 3  Anesthesia Plan: Epidural   Post-op Pain Management:    Induction:   PONV Risk Score and Plan: 2  Airway Management Planned: Natural Airway  Additional Equipment: None  Intra-op Plan:   Post-operative Plan:   Informed Consent: I have reviewed the patients History and Physical, chart, labs and discussed the procedure including the risks, benefits and alternatives for the proposed anesthesia with the patient or authorized representative who has indicated his/her understanding and acceptance.       Plan Discussed with:   Anesthesia Plan Comments:         Anesthesia Quick Evaluation

## 2024-06-20 NOTE — Progress Notes (Signed)
 Patient ID: Leslie Mccarthy, female   DOB: [birth year], 23 y.o.   MRN: [id number] Pt with no complaints. +FMs. Comfortable with epidural . Reports glucose of 57 - felt unwell; sipping sprite now with rise noted   VSS  EFM - 135, mod variability, no decels, Cat 1 TOCO - ctxs q  SVE - 6.5/80/-2  A/P: Prime at 36 5/7wks progressing well in labor s/p cytotec , IP foley, AROM and pit; T1DM - stable  - Continue with expectant mgmt  - Anticipate svd  - Monitor glucose

## 2024-06-20 NOTE — Progress Notes (Signed)
 Leslie Mccarthy is a 23 y.o. G1P0 at [redacted]w[redacted]d   Subjective: Pt with no complaints. Foley bulb out with last check No LOF. +FMs  Objective: BP 127/87   Pulse 90   Temp 98.3 F (36.8 C) (Oral)   Resp 16   Ht 5' 7 (1.702 m)   Wt 98.5 kg   LMP 09/20/2023   BMI 34.02 kg/m  No intake/output data recorded. No intake/output data recorded.  FHT:  FHR: 145 bpm, variability: moderate,  accelerations:  Present,  decelerations:  Absent UC:   regular, every 3 minutes SVE:   Dilation: 5 Effacement (%): 70 Station: -2 Exam by:: m wilkins rnc  Labs: Lab Results  Component Value Date   WBC 12.1 (H) 06/20/2024   HGB 12.5 06/20/2024   HCT 35.4 (L) 06/20/2024   MCV 88.9 06/20/2024   PLT 178 06/20/2024    Assessment / Plan: Induction of labor due to [facility] medical conditions,  progressing well on pitocin   Labor: Progressing normally ; foley out. AROM with clear fluid noted. Cervix now 5/50/-2 Preeclampsia:  n/a Fetal Wellbeing:  Category I Pain Control:  Labor support without medications I/D:  n/a Anticipated MOD:  NSVD  Ted LELON Solo, DO 06/20/2024, 2:18 PM

## 2024-06-21 ENCOUNTER — Encounter (HOSPITAL_COMMUNITY): Payer: Self-pay | Admitting: Student

## 2024-06-21 LAB — GLUCOSE, CAPILLARY: Glucose-Capillary: 102 mg/dL — ABNORMAL HIGH (ref 70–99)

## 2024-06-21 MED ORDER — SIMETHICONE 80 MG PO CHEW: 80.0000 mg | CHEWABLE_TABLET | ORAL | Status: DC | PRN

## 2024-06-21 MED ORDER — ZOLPIDEM TARTRATE 5 MG PO TABS
5.0000 mg | ORAL_TABLET | Freq: Every evening | ORAL | Status: DC | PRN
Start: 2024-06-21 — End: 2024-06-23

## 2024-06-21 MED ORDER — DIPHENHYDRAMINE HCL 25 MG PO CAPS: 25.0000 mg | ORAL_CAPSULE | Freq: Four times a day (QID) | ORAL | Status: DC | PRN

## 2024-06-21 MED ORDER — INSULIN ASPART 100 UNIT/ML IJ SOLN: 2.0000 [IU] | Freq: Three times a day (TID) | INTRAMUSCULAR | Status: DC

## 2024-06-21 MED ORDER — TETANUS-DIPHTH-ACELL PERTUSSIS 5-2.5-18.5 LF-MCG/0.5 IM SUSY: 0.5000 mL | PREFILLED_SYRINGE | Freq: Once | INTRAMUSCULAR | Status: DC

## 2024-06-21 MED ORDER — TRANEXAMIC ACID-NACL 1000-0.7 MG/100ML-% IV SOLN: 1000.0000 mg | INTRAVENOUS | Status: DC

## 2024-06-21 MED ORDER — COCONUT OIL OIL: 1.0000 | TOPICAL_OIL | Status: DC | PRN

## 2024-06-21 MED ORDER — PRENATAL MULTIVITAMIN CH
1.0000 | ORAL_TABLET | Freq: Every day | ORAL | Status: DC
  Administered 2024-06-21 – 2024-06-23 (×3): 1 via ORAL
  Filled 2024-06-21 (×3): qty 1

## 2024-06-21 MED ORDER — INSULIN ASPART 100 UNIT/ML IJ SOLN: 0.0000 [IU] | Freq: Three times a day (TID) | INTRAMUSCULAR | Status: DC

## 2024-06-21 MED ORDER — BENZOCAINE-MENTHOL 20-0.5 % EX AERO
1.0000 | INHALATION_SPRAY | CUTANEOUS | Status: DC | PRN
  Administered 2024-06-23: 1 via TOPICAL
  Filled 2024-06-21 (×2): qty 56

## 2024-06-21 MED ORDER — WITCH HAZEL-GLYCERIN EX PADS
1.0000 | MEDICATED_PAD | CUTANEOUS | Status: DC | PRN
  Administered 2024-06-23: 1 via TOPICAL

## 2024-06-21 MED ORDER — IBUPROFEN 600 MG PO TABS
600.0000 mg | ORAL_TABLET | Freq: Four times a day (QID) | ORAL | Status: DC
  Administered 2024-06-21 – 2024-06-23 (×9): 600 mg via ORAL
  Filled 2024-06-21 (×9): qty 1

## 2024-06-21 MED ORDER — ACETAMINOPHEN 325 MG PO TABS
650.0000 mg | ORAL_TABLET | ORAL | Status: DC | PRN
  Filled 2024-06-21: qty 2

## 2024-06-21 MED ORDER — ONDANSETRON HCL 4 MG PO TABS: 4.0000 mg | ORAL_TABLET | ORAL | Status: DC | PRN

## 2024-06-21 MED ORDER — OXYCODONE HCL 5 MG PO TABS: 5.0000 mg | ORAL_TABLET | ORAL | Status: DC | PRN

## 2024-06-21 MED ORDER — DIBUCAINE (PERIANAL) 1 % EX OINT: 1.0000 | TOPICAL_OINTMENT | CUTANEOUS | Status: DC | PRN

## 2024-06-21 MED ORDER — TRANEXAMIC ACID-NACL 1000-0.7 MG/100ML-% IV SOLN
INTRAVENOUS | Status: AC
  Administered 2024-06-21: 1000 mg
  Filled 2024-06-21: qty 100

## 2024-06-21 MED ORDER — OXYCODONE HCL 5 MG PO TABS: 10.0000 mg | ORAL_TABLET | ORAL | Status: DC | PRN

## 2024-06-21 MED ORDER — SENNOSIDES-DOCUSATE SODIUM 8.6-50 MG PO TABS
2.0000 | ORAL_TABLET | Freq: Every day | ORAL | Status: DC
  Administered 2024-06-22 – 2024-06-23 (×2): 2 via ORAL
  Filled 2024-06-21 (×2): qty 2

## 2024-06-21 MED ORDER — LACTATED RINGERS IV SOLN: INTRAVENOUS | Status: AC

## 2024-06-21 MED ORDER — ONDANSETRON HCL 4 MG/2ML IJ SOLN: 4.0000 mg | INTRAMUSCULAR | Status: DC | PRN

## 2024-06-21 MED ORDER — VARICELLA VIRUS VACCINE LIVE 1350 PFU/0.5ML IJ SUSR
0.5000 mL | Freq: Once | INTRAMUSCULAR | Status: AC
  Administered 2024-06-22: 0.5 mL via SUBCUTANEOUS
  Filled 2024-06-21: qty 0.5

## 2024-06-21 MED ORDER — OXYTOCIN-SODIUM CHLORIDE 30-0.9 UT/500ML-% IV SOLN: 2.5000 [IU]/h | INTRAVENOUS | Status: DC | PRN

## 2024-06-21 NOTE — Inpatient Diabetes Management (Signed)
 Inpatient Diabetes Program Recommendations  AACE/ADA: New Consensus Statement on Inpatient Glycemic Control (2015)  Target Ranges:  Prepandial:   less than 140 mg/dL      Peak postprandial:   less than 180 mg/dL (1-2 hours)      Critically ill patients:  140 - 180 mg/dL   Lab Results  Component Value Date   GLUCAP 102 (H) 06/21/2024   HGBA1C 6.3 (A) 06/05/2024    Review of Glycemic Control  Diabetes history: DM type 1 wears Omnipod 5 plus Dexcom G6 sensor.    Post-delivery insulin  pump settings per Endocrinologist. NOTE: settings were programed slightly lower than recommendations due to limited ability as pt basal pump settings are in .005 increments, not .025    Pump   Omnipod Settings   Insulin  type   Humalog     Basal rate          0000 0.625 u/h =>0.6 u/h    0600 0.725 u/h =>0.7 u/h    2300 0.675 u/h =>0.65 u/h         I:C ratio          0000 1:6                              Sensitivity          0000  40         Goal          0000  110    Close monitoring by CGM Close follow up with Endocrinology  Thanks,  Clotilda Bull RN, MSN, BC-ADM Inpatient Diabetes Coordinator Team Pager [phone] (8a-5p)

## 2024-06-21 NOTE — Progress Notes (Signed)
 Post Partum Day 0 Subjective: Doing well in the immediate postpartum period. Pain is well controlled. Ambulating well. Has voided once. Eating and drinking well. Lochia appropriate. Reports that she does not know her postpartum pump settings and was told someone from the diabetes team would come help her enter her pump settings.   Objective:    06/21/2024   11:00 AM 06/21/2024   10:39 AM 06/21/2024    6:55 AM  Vitals with BMI  Systolic  123 118  Diastolic  73 76  Pulse 90 80 80     Physical Exam:  General: alert, cooperative, and no distress Lochia: appropriate Uterine Fundus: firm DVT Evaluation: No evidence of DVT seen on physical exam.  Recent Labs    06/20/24 0639  HGB 12.5  HCT 35.4*    Assessment/Plan: Leslie Mccarthy is a 23 year old G1 now P66 PPD0 s/p SVD at [redacted]w[redacted]d after IOL for uncontrolled T1DM. -PPD0: routine postpartum care -T1DM: being seen by inpatient diabetes coordinator (We appreciate their assistance!), who confirmed that Leslie Mccarthy has a postpartum insulin  settings per her endocrinologist, however Leslie Mccarthy reports that she does not know her postpartum pump settings. I have contacted diabetes team again and they plan to assist with ensuring appropriate pump settings. CBG and insulin  pump orders placed, remaining insulin  orders cancelled per diabetes manager recs. CBG postpartum 102 this AM. -anx/depression: no meds. Plan pp mood check -asthma: albuterol PRN  -varicella non-immune: patient desires inpatient varicella vaccine, ordered.   Dispo: Anticipate discharge PPD1-2.    LOS: 1 day   Leslie DELENA Husky, MD 06/21/2024, 10:43 AM

## 2024-06-21 NOTE — Inpatient Diabetes Management (Signed)
 Inpatient Diabetes Program Recommendations  AACE/ADA: New Consensus Statement on Inpatient Glycemic Control (2015)  Target Ranges:  Prepandial:   less than 140 mg/dL      Peak postprandial:   less than 180 mg/dL (1-2 hours)      Critically ill patients:  140 - 180 mg/dL   Lab Results  Component Value Date   GLUCAP 102 (H) 06/21/2024   HGBA1C 6.3 (A) 06/05/2024    Patient has postpartum insulin  pump settings.  Please discontinue SQ insulin  and order insulin  pump with CBGs ac/hs and 2am.    Thank you, Wyvonna Pinal, MSN, CDCES Diabetes Coordinator Inpatient Diabetes Program [phone] (team pager from 8a-5p)

## 2024-06-21 NOTE — Anesthesia Postprocedure Evaluation (Signed)
 Anesthesia Post Note  Patient: BERNETTE SEEMAN  Procedure(s) Performed: AN AD HOC LABOR EPIDURAL     Patient location during evaluation: Mother Baby Anesthesia Type: Epidural Level of consciousness: awake, oriented and awake and alert Pain management: pain level controlled Vital Signs Assessment: post-procedure vital signs reviewed and stable Respiratory status: spontaneous breathing, nonlabored ventilation and respiratory function stable Cardiovascular status: stable Postop Assessment: no headache, patient able to bend at knees, adequate PO intake and no apparent nausea or vomiting (Patient has not walked yet but is bending legs easily.) Anesthetic complications: no   No notable events documented.  Last Vitals:  Vitals:   06/21/24 0456 06/21/24 0655  BP: 125/83 118/76  Pulse: 97 80  Resp: 16 16  Temp: 36.4 C 36.9 C  SpO2: 97% 97%    Last Pain:  Vitals:   06/21/24 0828  TempSrc:   PainSc: 0-No pain   Pain Goal:                   Luvinia Lucy

## 2024-06-21 NOTE — Progress Notes (Signed)
 MOB was referred for history of depression/anxiety.  * Referral screened out by Clinical Social Worker because none of the following criteria appear to apply:  ~ History of anxiety/depression during this pregnancy, or of post-partum depression following prior delivery.  ~ Diagnosis of anxiety and/or depression within last 3 years  Per OB notes, MOB did not indicate any signs/symptoms during her pregnancy.  Edinburgh score 6  Anxiety and Depression 2017  OR  * MOB's symptoms currently being treated with medication and/or therapy.  Please contact the Clinical Social Worker if needs arise, by [facility] request, or if MOB scores greater than 9/yes to question 10 on Edinburgh Postpartum Depression Screen.  Rosina Molt, [place] Clinical Social Worker [phone]

## 2024-06-22 LAB — CBC
HCT: 28.8 % — ABNORMAL LOW (ref 36.0–46.0)
Hemoglobin: 9.8 g/dL — ABNORMAL LOW (ref 12.0–15.0)
MCH: 31.1 pg (ref 26.0–34.0)
MCHC: 34 g/dL (ref 30.0–36.0)
MCV: 91.4 fL (ref 80.0–100.0)
Platelets: 155 K/uL (ref 150–400)
RBC: 3.15 MIL/uL — ABNORMAL LOW (ref 3.87–5.11)
RDW: 12.1 % (ref 11.5–15.5)
WBC: 12.4 K/uL — ABNORMAL HIGH (ref 4.0–10.5)
nRBC: 0 % (ref 0.0–0.2)

## 2024-06-22 LAB — BIRTH TISSUE RECOVERY COLLECTION (PLACENTA DONATION)

## 2024-06-22 NOTE — Progress Notes (Signed)
 Fasting blood glucose was 134 this AM. MD on call said to place order to use Dexcom G6. She will give herself 3 units every hour.

## 2024-06-22 NOTE — Progress Notes (Signed)
 Post Partum Day 1 Subjective: Baby transferred to NICU overnight for hypoglycemia Lochia normal. Tolerating PO. Voiding without difficulty  Objective: Blood pressure (!) 93/55, pulse 66, temperature 98.4 F (36.9 C), temperature source Oral, resp. rate 16, height 5' 7 (1.702 m), weight 98.5 kg, last menstrual period 09/20/2023, SpO2 97%, unknown if currently breastfeeding.  Physical Exam:  General: alert, cooperative, and no distress Lochia: appropriate Uterine Fundus: firm DVT Evaluation: No cords or calf tenderness. Calf/Ankle edema is present.  Recent Labs    06/20/24 0639 06/22/24 0436  HGB 12.5 9.8*  HCT 35.4* 28.8*    Assessment/Plan: 23 yo G1P0101 PPD#1 s/p PT-SVD following IOL for poorly controlled T1DM - PP: routine care  - T1DM: diabetes coordinator following. Omnipod/dexcom. BG much improved PP - VNI: varicella vaccine ordered - Rh positive   LOS: 2 days   Larraine DELENA Sharps, DO 06/22/2024, 10:13 AM

## 2024-06-23 ENCOUNTER — Inpatient Hospital Stay (HOSPITAL_COMMUNITY)

## 2024-06-23 LAB — GLUCOSE, CAPILLARY: Glucose-Capillary: 134 mg/dL — ABNORMAL HIGH (ref 70–99)

## 2024-06-23 MED ORDER — POLYETHYLENE GLYCOL 3350 17 GM/SCOOP PO POWD: 17.0000 g | Freq: Every day | ORAL | Status: AC | PRN

## 2024-06-23 MED ORDER — IBUPROFEN 600 MG PO TABS: 600.0000 mg | ORAL_TABLET | Freq: Four times a day (QID) | ORAL | 0 refills | Status: AC | PRN

## 2024-06-23 NOTE — Progress Notes (Signed)
 Post Partum Day 2 Subjective: Baby discharged from NICU last night. Starting bili lights today.   Pain controlled. Lochia normal. Voiding and ambulating without difficulty  Objective: Blood pressure 117/80, pulse 69, temperature 98 F (36.7 C), temperature source Oral, resp. rate 18, height 5' 7 (1.702 m), weight 98.5 kg, last menstrual period 09/20/2023, SpO2 99%, unknown if currently breastfeeding.  Physical Exam:  General: alert, cooperative, and no distress Lochia: appropriate Uterine Fundus: deferred, skin-to-skin DVT Evaluation: No evidence of DVT seen on physical exam. No significant calf/ankle edema.  Recent Labs    06/22/24 0436  HGB 9.8*  HCT 28.8*    Assessment/Plan: 23 yo G1P0101 PPD#2 s/p PT-SVD following IOL for poorly controlled T1DM - PP: routine care  - T1DM: diabetes coordinator following. Omnipod/dexcom. BG much improved PP. Has f/u with endo in 3 weeks - VNI: varicella vaccine given - Rh positive - DC: discharge to room   LOS: 3 days   Larraine DELENA Sharps, DO 06/23/2024, 10:20 AM

## 2024-06-23 NOTE — Discharge Summary (Signed)
 Postpartum Discharge Summary  Date of Service updated     Patient Name: Leslie Mccarthy DOB: [birth year] MRN: [id number]  Date of admission: 06/20/2024 Delivery date:06/21/2024 Delivering provider: DELANA BASE [facility] Date of discharge: 06/23/2024  Admitting diagnosis: Preexisting diabetes complicating pregnancy in third trimester, antepartum [O24.313] Intrauterine pregnancy: [redacted]w[redacted]d     Secondary diagnosis:  Principal Problem:   Preexisting diabetes complicating pregnancy in third trimester, antepartum  Additional problems: asthma in pregnancy, varicella non-immune status antepartum, mental disorder in pregnancy    Discharge diagnosis: Preterm Pregnancy Delivered and type 1 diabetes in pregnancy                                              Post partum procedures:N/A Induction: AROM, Pitocin , Cytotec , and IP Foley Complications: None  Hospital course: Induction of Labor With Vaginal Delivery   23 y.o. yo G1P0101 at [redacted]w[redacted]d was admitted to the hospital 06/20/2024 for induction of labor.  Indication for induction: poorly controlled type 1 diabetes in pregnancy.  Patient had an uncomplicated labor course.  Membrane Rupture Time/Date: 2:12 PM,06/20/2024  Delivery Method:Vaginal, Spontaneous Operative Delivery:N/A Episiotomy: None Lacerations:  2nd degree;Vaginal Details of delivery can be found in separate delivery note.  Patient personally had a uncomplicated postpartum course by her newborn required NICU admission for hypoglycemia. Patient is discharged home 06/23/24.  Newborn Data: Birth date:06/21/2024 Birth time:3:03 AM Gender:Female Living status:Living Apgars:7 ,8  Weight:3560 g  Magnesium Sulfate received: No BMZ received: No Rhophylac:N/A MMR:N/A T-DaP:Given prenatally Flu: No, recent last season prior to pregnancy RSV Vaccine received: No Transfusion:No Immunizations administered: Immunization History  Administered Date(s) Administered   DTaP 11/06/2002,  01/13/2003, 03/31/2003, 08/13/2003, 01/17/2008   HIB (PRP-OMP) 11/06/2002, 01/13/2003, 03/31/2003, 08/13/2003   Hepatitis A 01/17/2008, 05/31/2016   Hepatitis B, PED/ADOLESCENT 04-13-2002, 09/09/2002, 03/31/2003   IPV 11/06/2002, 01/13/2003, 08/13/2003, 01/17/2008   MMR 02/24/2004, 01/17/2008   Meningococcal Conjugate 05/08/2014   Tdap 05/08/2014   Varicella 02/24/2004, 01/17/2008, 06/22/2024    Physical exam  Vitals:   06/22/24 0600 06/22/24 1304 06/22/24 2012 06/23/24 0543  BP: (!) 93/55 114/68 112/67 117/80  Pulse: 66 68 81 69  Resp: 16 17 18 18   Temp:  97.8 F (36.6 C) 97.6 F (36.4 C) 98 F (36.7 C)  TempSrc:  Oral Oral Oral  SpO2: 97%  99% 99%  Weight:      Height:       General: alert, cooperative, and no distress Lochia: appropriate Uterine Fundus: deferred, skin-to-skin DVT Evaluation: No evidence of DVT seen on physical exam. No significant calf/ankle edema. Labs: Lab Results  Component Value Date   WBC 12.4 (H) 06/22/2024   HGB 9.8 (L) 06/22/2024   HCT 28.8 (L) 06/22/2024   MCV 91.4 06/22/2024   PLT 155 06/22/2024      Latest Ref Rng & Units 06/20/2024    6:39 AM  CMP  Glucose 70 - 99 mg/dL 870   BUN 6 - 20 mg/dL 6   Creatinine 9.55 - 8.99 mg/dL 9.33   Sodium 864 - 854 mmol/L 135   Potassium 3.5 - 5.1 mmol/L 3.5   Chloride 98 - 111 mmol/L 102   CO2 22 - 32 mmol/L 20   Calcium 8.9 - 10.3 mg/dL 9.8    Edinburgh Score:    06/21/2024    2:41 PM  Edinburgh Postnatal Depression Scale Screening Tool  I have been able to laugh and see the funny side of things. 0  I have looked forward with enjoyment to things. 0  I have blamed myself unnecessarily when things went wrong. 1  I have been anxious or worried for no good reason. 1  I have felt scared or panicky for no good reason. 1  Things have been getting on top of me. 1  I have been so unhappy that I have had difficulty sleeping. 0  I have felt sad or miserable. 1  I have been so unhappy that I have  been crying. 1  The thought of harming myself has occurred to me. 0  Edinburgh Postnatal Depression Scale Total 6      After visit meds:  Allergies as of 06/23/2024   No Known Allergies      Medication List     TAKE these medications    B-D UF III MINI PEN NEEDLES 31G X 5 MM Misc Generic drug: Insulin  Pen Needle USE AS DIRECTED TO INJECT INSULIN  6 TIMES DAILY   Baqsimi  Two Pack 3 MG/DOSE Powd Generic drug: Glucagon  Place 3 mg into the nose as needed.   Dexcom G6 Receiver Devi 1 Device by Does not apply route as directed.   Dexcom G6 Sensor Misc Change sensor every 10 days   Dexcom G6 Transmitter Misc USE AS DIRECTED FOR 90 DAYS   ibuprofen  600 MG tablet Commonly known as: ADVIL  Take 1 tablet (600 mg total) by mouth every 6 (six) hours as needed for cramping.   insulin  lispro 100 UNIT/ML KwikPen Commonly known as: HumaLOG  KwikPen INJECT UP TO 50 UNITS PER DAY   insulin  lispro 100 UNIT/ML injection Commonly known as: HumaLOG  INJECT UP TO 200 UNITS INTO INSULIN  PUMP EVERY 2-3 DAYS PER PROVIDER GUIDANCE.   Omnipod 5 DexG7G6 Pods Gen 5 Misc Change pod every 2 days   OneTouch Verio IQ System w/Device Kit Use to check blood glucose   OneTouch Verio test strip Generic drug: glucose blood CHECK BLOOD SUGAR 6X DAY (90 DAY SUPPLY)   polyethylene glycol powder 17 GM/SCOOP powder Commonly known as: MiraLax  Take 17 g by mouth daily as needed for mild constipation.   PRENATAL VITAMIN PO         Discharge home in stable condition Infant Feeding: Bottle Infant Disposition:rooming in Discharge instruction: per After Visit Summary and Postpartum booklet. Activity: Advance as tolerated. Pelvic rest for 6 weeks.  Diet: routine diet Anticipated Birth Control: Abstinence Postpartum Appointment:6 weeks Additional Postpartum F/U: Postpartum Depression checkup Future Appointments: Future Appointments  Date Time Provider Department Center  07/11/2024 11:10 AM  Shamleffer, Donell Cardinal, MD [facility] None   Follow up Visit:  Follow-up Information     Claudene Mort A, DO. Go in 6 week(s).   Specialty: Obstetrics and Gynecology Why: Office will call you to schedule appointment Contact information: [address], [place] [address] [place] [address] [phone]                     06/23/2024 Mort DELENA Claudene, DO

## 2024-06-23 NOTE — Discharge Instructions (Signed)
 Call office with any concerns [phone]

## 2024-07-02 ENCOUNTER — Telehealth (HOSPITAL_COMMUNITY): Payer: Self-pay

## 2024-07-02 NOTE — Telephone Encounter (Signed)
 07/02/2024 1634  Name: Leslie Mccarthy MRN: [id number] DOB: 04-03-2001  Reason for Call:  Transition of Care Hospital Discharge Call  Contact Status: Patient Contact Status: Complete  Language assistant needed:          Follow-Up Questions: Do You Have Any Concerns About Your Health As You Heal From Delivery?: No Do You Have Any Concerns About Your Infants Health?: No  Edinburgh Postnatal Depression Scale:  In the Past 7 Days: I have been able to laugh and see the funny side of things.: As much as I always could I have looked forward with enjoyment to things.: As much as I ever did I have blamed myself unnecessarily when things went wrong.: No, never I have been anxious or worried for no good reason.: No, not at all I have felt scared or panicky for no good reason.: No, not at all Things have been getting on top of me.: No, I have been coping as well as ever I have been so unhappy that I have had difficulty sleeping.: Not at all I have felt sad or miserable.: No, not at all I have been so unhappy that I have been crying.: No, never The thought of harming myself has occurred to me.: Never [place] Postnatal Depression Scale Total: 0  PHQ2-9 Depression Scale:     Discharge Follow-up: Edinburgh score requires follow up?: No Patient was advised of the following resources:: Breastfeeding Support Group, Support Group  Post-discharge interventions: Reviewed Newborn Safe Sleep Practices  Signature  Rosaline Deretha PEAK

## 2024-07-05 DIAGNOSIS — Z419 Encounter for procedure for purposes other than remedying health state, unspecified: Secondary | ICD-10-CM | POA: Diagnosis not present

## 2024-07-06 DIAGNOSIS — R69 Illness, unspecified: Secondary | ICD-10-CM | POA: Diagnosis not present

## 2024-07-11 ENCOUNTER — Encounter: Payer: Self-pay | Admitting: Internal Medicine

## 2024-07-11 ENCOUNTER — Ambulatory Visit: Admitting: Internal Medicine

## 2024-07-11 VITALS — BP 124/70 | HR 106 | Ht 67.0 in | Wt 179.0 lb

## 2024-07-11 DIAGNOSIS — E109 Type 1 diabetes mellitus without complications: Secondary | ICD-10-CM

## 2024-07-11 NOTE — Patient Instructions (Addendum)

## 2024-07-11 NOTE — Progress Notes (Signed)
 Name: Leslie Mccarthy  MRN/ DOB: [id number], August 20, 2001   Age/ Sex: 23 y.o., female    PCP: Debrah Josette ORN., PA-C   Reason for Endocrinology Evaluation: Type 1 Diabetes Mellitus     Date of Initial Endocrinology Visit: 02/02/2024    PATIENT IDENTIFIER: Leslie Mccarthy is a 23 y.o. female with a past medical history of DM. The patient presented for initial endocrinology clinic visit on 02/02/2024  for consultative assistance with her diabetes management.    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  On her initial visit to our clinic she had an A1c of 6.7%, she was [redacted] weeks pregnant.  She is s/p vaginal delivery on 06/21/2024- Girl   SUBJECTIVE:   During the last visit (04/15/2024): A1c 6.1%    Today (07/11/24): Leslie Mccarthy is here for follow-up on diabetes management.  She checks blood sugars multiple times daily. The patient has  had hypoglycemic episodes since the last clinic visit. The patient is  symptomatic with these episodes.  Patient is s/p vaginal delivery on 06/21/2024 Not nursing  No nausea or vomiting  Continues with constipation  Indigestion resolved      This patient with type 1 diabetes is treated with Omnipod (insulin  pump). During the visit the pump basal and bolus doses were reviewed including carb/insulin  rations and supplemental doses. The clinical list was updated. The glucose meter download was reviewed in detail to determine if the current pump settings are providing the best glycemic control without excessive hypoglycemia.  Pump and meter download:    Pump   Omnipod Settings   Insulin  type   Humalog    Basal rate       0000 0.6 u/h    0600 0.7 u/h    2300 0.65 u/h       I:C ratio       0000 1:4   1200 1:4   1630 1:4          Sensitivity        0000  40      Goal       0000  110             Type & Model of Pump: Omnipod Insulin  Type: Currently using Humalog .  Body mass index is 28.04 kg/m.   PUMP STATISTICS: Average BG: 146 Average Daily Carbs (g): 108.1 Average Total Daily Insulin : 56.3 Average Daily Basal: 29.4 (52 %) Average Daily Bolus: 26.9 (48 %)   HOME DIABETES REGIMEN: Humalog     Statin: No ACE-I/ARB: No  CONTINUOUS GLUCOSE MONITORING RECORD INTERPRETATION    Dates of Recording:9/5-9/18/2025  Sensor description: Dexcom  Results statistics:   CGM use % of time 90.2  Average and SD 146/44  Time in range 79 %  % Time Above 180 17  % Time above 250 3  % Time Below target 1   Glycemic patterns summary: BGs are optimal throughout the day and night  Hyperglycemic episodes postprandial  Hypoglycemic episodes occurred overnight  Overnight periods: Optimal    DIABETIC COMPLICATIONS: Microvascular complications:   Denies: CKD, retinopathy, neuropathy Last eye exam: Completed 2024  Macrovascular complications:   Denies: CAD, PVD, CVA   PAST HISTORY: Past Medical History:  Past Medical  History:  Diagnosis Date   Amenorrhea, secondary 12/18/2018   Anxiety    Asthma    Constipation in pediatric patient 12/18/2018   Depression    Diabetes mellitus without complication [facility])    Past Surgical History:  Past Surgical History:  Procedure Laterality Date   NO PAST SURGERIES      Social History:  reports that she has never smoked. She has never been exposed to tobacco smoke. She has never used smokeless tobacco. She reports that she does not currently use alcohol. She reports that she does not use drugs. Family History:  Family History  Problem Relation Age of Onset   Healthy Mother    Healthy Father    Hypertension Maternal Grandmother    Diabetes Maternal Grandmother    Asthma Maternal Grandmother    Myasthenia gravis Maternal Grandmother    Thyroid  disease Maternal  Grandmother    Kidney disease Maternal Grandfather    COPD Maternal Grandfather    Heart disease Maternal Grandfather    Hypertension Maternal Grandfather    Hypertension Paternal Grandmother    Cancer Paternal Grandmother    Diabetes Paternal Grandmother    Hypertension Paternal Grandfather    Diabetes Paternal Grandfather    Diabetes Other    Stroke Neg Hx      HOME MEDICATIONS: Allergies as of 07/11/2024   No Known Allergies      Medication List        Accurate as of July 11, 2024 11:10 AM. If you have any questions, ask your nurse or doctor.          B-D UF III MINI PEN NEEDLES 31G X 5 MM Misc Generic drug: Insulin  Pen Needle USE AS DIRECTED TO INJECT INSULIN  6 TIMES DAILY   Baqsimi  Two Pack 3 MG/DOSE Powd Generic drug: Glucagon  Place 3 mg into the nose as needed.   Dexcom G6 Receiver Devi 1 Device by Does not apply route as directed.   Dexcom G6 Sensor Misc Change sensor every 10 days   Dexcom G6 Transmitter Misc USE AS DIRECTED FOR 90 DAYS   ibuprofen  600 MG tablet Commonly known as: ADVIL  Take 1 tablet (600 mg total) by mouth every 6 (six) hours as needed for cramping.   insulin  lispro 100 UNIT/ML KwikPen Commonly known as: HumaLOG  KwikPen INJECT UP TO 50 UNITS PER DAY   insulin  lispro 100 UNIT/ML injection Commonly known as: HumaLOG  INJECT UP TO 200 UNITS INTO INSULIN  PUMP EVERY 2-3 DAYS PER PROVIDER GUIDANCE.   Omnipod 5 DexG7G6 Pods Gen 5 Misc Change pod every 2 days   OneTouch Verio IQ System w/Device Kit Use to check blood glucose   OneTouch Verio test strip Generic drug: glucose blood CHECK BLOOD SUGAR 6X DAY (90 DAY SUPPLY)   polyethylene glycol powder 17 GM/SCOOP powder Commonly known as: MiraLax  Take 17 g by mouth daily as needed for mild constipation.   PRENATAL VITAMIN PO         ALLERGIES: No Known Allergies   REVIEW OF SYSTEMS: A comprehensive ROS was conducted with the patient and is negative except as per  HPI    OBJECTIVE:   VITAL SIGNS: BP 124/70 (BP Location: Left Arm, Patient Position: Sitting, Cuff Size: Normal)   Pulse (!) 106   Ht 5' 7 (1.702 m)   Wt 179 lb (81.2 kg)   LMP 09/20/2023   SpO2 99%   BMI 28.04 kg/m    PHYSICAL EXAM:  General: Pt appears well and is in NAD  Neck: General: Supple without adenopathy or carotid bruits. Thyroid : Thyroid  size normal.  No goiter or nodules appreciated.   Lungs: Clear with good BS bilat   Heart: RRR   Abdomen:  soft, nontender  Extremities:  Lower extremities - No pretibial edema.   Neuro: MS is good with appropriate affect, pt is alert and Ox3    DM foot exam: 02/02/2024  The skin of the feet is intact without sores or ulcerations. The pedal pulses are 2+ on right and 2+ on left. The sensation is intact to a screening 5.07, 10 gram monofilament bilaterally   DATA REVIEWED:  Lab Results  Component Value Date   HGBA1C 6.3 (A) 06/05/2024   HGBA1C 6.1 (A) 04/15/2024   HGBA1C 6.7 (A) 02/02/2024    Latest Reference Range & Units 06/20/24 06:39  Sodium 135 - 145 mmol/L 135  Potassium 3.5 - 5.1 mmol/L 3.5  Chloride 98 - 111 mmol/L 102  CO2 22 - 32 mmol/L 20 (L)  Glucose 70 - 99 mg/dL 870 (H)  BUN 6 - 20 mg/dL 6  Creatinine 9.55 - 8.99 mg/dL 9.33  Calcium 8.9 - 89.6 mg/dL 9.8  Anion gap 5 - 15  13  GFR, Estimated >60 mL/min >60  (L): Data is abnormally low (H): Data is abnormally high    ASSESSMENT / PLAN / RECOMMENDATIONS:   1) Type 1 Diabetes Mellitus,Optimally controlled, Without complications - Most recent A1c of 6.1 %. Goal A1c < 6.5 %.    - A1c at goal -I have reviewed pump/CGM download, patient has been noted with tight BG's overnight, will decrease basal insulin  at midnight -Patient has been noted with postprandial hyperglycemia lunch and dinnertime, I will adjust insulin  to carb ratio for that time   Pump   Omnipod Settings   Insulin  type   Humalog    Basal rate       0000 0.55 u/h    0600 0.7 u/h     2300 0.65 u/h       I:C ratio       0000 1:4   1100 1:3              Sensitivity       0000  40      Goal       0000  110           MEDICATIONS: Humalog   EDUCATION / INSTRUCTIONS: BG monitoring instructions: Patient is instructed to check her blood sugars before each meal and 2 hours after the meal Call [facility] if: BG persistently < 60  I reviewed the Rule of 15 for the treatment of hypoglycemia in detail with the patient. Literature supplied.   2) Diabetic complications:  Eye: Does not have known diabetic retinopathy.  Neuro/ Feet: Does not have known diabetic peripheral neuropathy. Renal: Patient does not have known baseline CKD. She is not on an ACEI/ARB at present.   Follow-up in 4 months     Signed electronically by: Stefano Redgie Butts, MD  [facility] Endocrinology  [facility] [address] [place] [place], [place] [address] Phone: [phone] FAX: [phone]   CC: Debrah Josette Leslie Mccarthy [address] [place] [address] Phone: [phone]  Fax: [phone]    Return to Endocrinology clinic as below: No future appointments.

## 2024-07-31 DIAGNOSIS — Z1389 Encounter for screening for other disorder: Secondary | ICD-10-CM | POA: Diagnosis not present

## 2024-07-31 DIAGNOSIS — Z3009 Encounter for other general counseling and advice on contraception: Secondary | ICD-10-CM | POA: Diagnosis not present

## 2024-08-12 ENCOUNTER — Other Ambulatory Visit (HOSPITAL_COMMUNITY): Payer: Self-pay

## 2024-08-12 ENCOUNTER — Telehealth: Payer: Self-pay | Admitting: Pharmacy Technician

## 2024-08-12 NOTE — Telephone Encounter (Signed)
 Pharmacy Patient Advocate Encounter   Received notification from CoverMyMeds that prior authorization for Omnipod 5 DexG7G6 Pods Gen 5  is due for renewal.   Insurance verification completed.   The patient is insured through [employer]. Key: [id number]  Action: Medication is now available without a prior authorization.    **Previous PA doesn't expire until 09/07/24.**

## 2024-08-17 ENCOUNTER — Encounter: Payer: Self-pay | Admitting: Internal Medicine

## 2024-08-19 ENCOUNTER — Other Ambulatory Visit: Payer: Self-pay

## 2024-08-19 MED ORDER — DEXCOM G6 SENSOR MISC: 3 refills | Status: DC

## 2024-08-26 ENCOUNTER — Encounter: Payer: Self-pay | Admitting: Radiology

## 2024-09-22 ENCOUNTER — Encounter: Payer: Self-pay | Admitting: Internal Medicine

## 2024-09-22 DIAGNOSIS — E109 Type 1 diabetes mellitus without complications: Secondary | ICD-10-CM

## 2024-09-23 MED ORDER — OMNIPOD 5 DEXG7G6 PODS GEN 5 MISC: 12 refills | Status: DC

## 2024-11-08 ENCOUNTER — Encounter: Payer: Self-pay | Admitting: Internal Medicine

## 2024-11-08 ENCOUNTER — Ambulatory Visit: Admitting: Internal Medicine

## 2024-11-08 ENCOUNTER — Other Ambulatory Visit

## 2024-11-08 DIAGNOSIS — E109 Type 1 diabetes mellitus without complications: Secondary | ICD-10-CM | POA: Diagnosis not present

## 2024-11-08 LAB — POCT GLYCOSYLATED HEMOGLOBIN (HGB A1C): Hemoglobin A1C: 7 % — AB (ref 4.0–5.6)

## 2024-11-08 NOTE — Patient Instructions (Signed)

## 2024-11-08 NOTE — Progress Notes (Unsigned)
 " Name: Leslie Mccarthy  MRN/ DOB: [id number], Jun 12, 2001   Age/ Sex: 24 y.o., female    PCP: Debrah Josette ORN., PA-C   Reason for Endocrinology Evaluation: Type 1 Diabetes Mellitus     Date of Initial Endocrinology Visit: 02/02/2024    PATIENT IDENTIFIER: Leslie Mccarthy is a 24 y.o. female with a past medical history of DM. The patient presented for initial endocrinology clinic visit on 02/02/2024  for consultative assistance with her diabetes management.    HPI: Ms. 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  On her initial visit to our clinic she had an A1c of 6.7%, she was [redacted] weeks pregnant.  She is S/P delivery 06/21/2024- baby girl Wrenly  SUBJECTIVE:   During the last visit (07/11/2024): A1c 6.1%    Today (11/08/24): Leslie Mccarthy is here for follow-up on diabetes management.  She checks blood sugars multiple times daily. The patient has  had hypoglycemic episodes since the last clinic visit. The patient is  symptomatic with these episodes.  She is not nursing  No nausea  Continues with constipation  No heartburn     This patient with type 1 diabetes is treated with Omnipod (insulin  pump). During the visit the pump basal and bolus doses were reviewed including carb/insulin  rations and supplemental doses. The clinical list was updated. The glucose meter download was reviewed in detail to determine if the current pump settings are providing the best glycemic control without excessive hypoglycemia.  Pump and CGM download:     Pump   Omnipod Settings   Insulin  type   Humalog    Basal rate       0000 0.55 u/h    0600 0.7 u/h    2300 0.65 u/h       I:C ratio       0000 1:4   1100 1:3              Sensitivity       0000  40      Goal       0000  110       Type & Model of  Pump: Omnipod Insulin  Type: Currently using Humalog .  Body mass index is 27.41 kg/m.   PUMP STATISTICS:                  HOME DIABETES REGIMEN: Humalog     Statin: No ACE-I/ARB: No     DIABETIC COMPLICATIONS: Microvascular complications:   Denies: CKD, retinopathy, neuropathy Last eye exam: Completed 2024  Macrovascular complications:   Denies: CAD, PVD, CVA   PAST HISTORY: Past Medical History:  Past Medical History:  Diagnosis Date   Amenorrhea, secondary 12/18/2018   Anxiety    Asthma    Constipation in pediatric patient 12/18/2018   Depression    Diabetes mellitus without complication (HCC)    Past Surgical History:  Past Surgical History:  Procedure Laterality Date   NO PAST SURGERIES      Social History:  reports that she has never smoked. She has never been exposed to tobacco smoke. She has never used smokeless tobacco. She reports that she does not currently use alcohol. She reports that she does not use drugs. Family History:  Family History  Problem Relation Age of Onset   Healthy Mother    Healthy Father    Hypertension Maternal Grandmother    Diabetes Maternal Grandmother    Asthma Maternal Grandmother    Myasthenia gravis Maternal Grandmother    Thyroid  disease Maternal Grandmother    Kidney disease Maternal Grandfather    COPD Maternal Grandfather    Heart disease Maternal Grandfather    Hypertension Maternal Grandfather    Hypertension Paternal Grandmother    Cancer Paternal Grandmother    Diabetes Paternal Grandmother    Hypertension Paternal Grandfather    Diabetes Paternal Grandfather    Diabetes Other    Stroke Neg Hx      HOME MEDICATIONS: Allergies as of 11/08/2024   No Known Allergies      Medication List        Accurate as of November 08, 2024  1:21 PM. If you have any questions, ask your nurse or doctor.          B-D UF III MINI PEN NEEDLES 31G X 5 MM Misc Generic drug: Insulin  Pen  Needle USE AS DIRECTED TO INJECT INSULIN  6 TIMES DAILY   Baqsimi  Two Pack 3 MG/DOSE Powd Generic drug: Glucagon  Place 3 mg into the nose as needed.   Dexcom G6 Receiver Devi 1 Device by Does not apply route as directed.   Dexcom G6 Sensor Misc Change sensor every 10 days   Dexcom G6 Sensor Misc Change every 10 days   Dexcom G6 Transmitter Misc USE AS DIRECTED FOR 90 DAYS   ibuprofen  600 MG tablet Commonly known as: ADVIL  Take 1 tablet (600 mg total) by mouth every 6 (six) hours as needed for cramping.   insulin  lispro 100 UNIT/ML KwikPen Commonly known as: HumaLOG  KwikPen INJECT UP TO 50 UNITS PER DAY   insulin  lispro 100 UNIT/ML injection Commonly known as: HumaLOG  INJECT UP TO 200 UNITS INTO INSULIN  PUMP EVERY 2-3 DAYS PER PROVIDER GUIDANCE.   Omnipod 5 DexG7G6 Pods Gen 5 Misc Change pod every 2 days   OneTouch Verio IQ System w/Device Kit Use to check blood glucose   OneTouch Verio test strip Generic drug: glucose blood CHECK BLOOD SUGAR 6X DAY (90 DAY SUPPLY)   polyethylene glycol powder 17 GM/SCOOP powder Commonly known as: MiraLax  Take 17 g by mouth daily as needed for mild constipation.   PRENATAL VITAMIN PO         ALLERGIES: No Known Allergies   REVIEW OF SYSTEMS: A comprehensive ROS was conducted with the patient and is negative except as per HPI    OBJECTIVE:   VITAL SIGNS: BP 118/80   Ht 5' 7 (1.702 m)   Wt 175 lb (79.4 kg)   BMI 27.41 kg/m    PHYSICAL EXAM:  General: Pt appears well and is in NAD  Neck: Thyroid : Thyroid  size normal.  No goiter or nodules appreciated.   Lungs: Clear with good BS bilat   Heart: RRR   Abdomen:  soft, nontender  Extremities:  Lower extremities - No pretibial edema.   Neuro: MS is good with appropriate affect, pt is alert and Ox3    DM foot exam: 11/08/2024  The skin of the feet is intact without sores or ulcerations. The pedal pulses are 2+ on right and 2+ on left. The sensation is intact to a  screening 5.07, 10 gram monofilament bilaterally   DATA REVIEWED:  Lab Results  Component Value Date   HGBA1C 6.3 (A) 06/05/2024   HGBA1C 6.1 (A)  04/15/2024   HGBA1C 6.7 (A) 02/02/2024    Latest Reference Range & Units 12/11/23 10:13  Sodium 135 - 146 mmol/L 136  Potassium 3.5 - 5.3 mmol/L 4.3  Chloride 98 - 110 mmol/L 102  CO2 20 - 32 mmol/L 24  Glucose 65 - 99 mg/dL 879 (H)  BUN 7 - 25 mg/dL 7  Creatinine 9.49 - 9.03 mg/dL 9.27  Calcium 8.6 - 89.7 mg/dL 8.8  BUN/Creatinine Ratio 6 - 22 (calc) SEE NOTE:  eGFR > OR = 60 mL/min/1.67m2 122  AG Ratio 1.0 - 2.5 (calc) 1.4  AST 10 - 30 U/L 26  ALT 6 - 29 U/L 30 (H)  Total Protein 6.1 - 8.1 g/dL 6.8  Total Bilirubin 0.2 - 1.2 mg/dL 1.0  Total CHOL/HDL Ratio <5.0 (calc) 2.4  Cholesterol <200 mg/dL 838  HDL Cholesterol > OR = 50 mg/dL 66  LDL Cholesterol (Calc) mg/dL (calc) 75  Non-HDL Cholesterol (Calc) <130 mg/dL (calc) 95  Triglycerides <150 mg/dL 881  (H): Data is abnormally high  ASSESSMENT / PLAN / RECOMMENDATIONS:   1) Type 1 Diabetes Mellitus,Optimally controlled, Without complications - Most recent A1c of 7.0 %. Goal A1c < 6.5 %.    - A1c at goal -I have reviewed pump/CGM download, patient has been noted with hypoglycemia overnight, will decrease basal insulin  at midnight -Patient continues with postprandial hyperglycemia, she assures me proper carbohydrate counting, she has a very strong insulin  to carb ratio of 1:3 ... I will change that as well -The patient will also be provided with postnatal pump setting     Pump   Omnipod Settings   Insulin  type   Humalog    Basal rate       0000 0.50 u/h    0600 0.65 u/h    1100 0.65 u/h       I:C ratio       0000 1:3   1100 1:2.5              Sensitivity       0000  35      Goal       0000  110         MEDICATIONS: Humalog   EDUCATION / INSTRUCTIONS: BG monitoring instructions: Patient is instructed to check her blood sugars before each meal and 2  hours after the meal Call [facility] clinic if: BG persistently < 60  I reviewed the Rule of 15 for the treatment of hypoglycemia in detail with the patient. Literature supplied.   2) Diabetic complications:  Eye: Does not have known diabetic retinopathy.  Neuro/ Feet: Does not have known diabetic peripheral neuropathy. Renal: Patient does not have known baseline CKD. She is not on an ACEI/ARB at present.    Signed electronically by: Stefano Redgie Butts, MD  [facility] Endocrinology  [facility] [address] [place] [place], [place] [address] Phone: [phone] FAX: [phone]   CC: Debrah Josette MICAEL DEVONNA [address] [place] [address] Phone: [phone]  Fax: [phone]    Return to Endocrinology clinic as below: No future appointments.    "

## 2024-11-09 LAB — BASIC METABOLIC PANEL WITH GFR
BUN: 7 mg/dL (ref 7–25)
CO2: 25 mmol/L (ref 20–32)
Calcium: 9.5 mg/dL (ref 8.6–10.2)
Chloride: 103 mmol/L (ref 98–110)
Creat: 0.8 mg/dL (ref 0.50–0.96)
Glucose, Bld: 108 mg/dL — ABNORMAL HIGH (ref 65–99)
Potassium: 4.1 mmol/L (ref 3.5–5.3)
Sodium: 139 mmol/L (ref 135–146)
eGFR: 107 mL/min/1.73m2

## 2024-11-09 LAB — LIPID PANEL
Cholesterol: 166 mg/dL
HDL: 66 mg/dL
LDL Cholesterol (Calc): 85 mg/dL
Non-HDL Cholesterol (Calc): 100 mg/dL
Total CHOL/HDL Ratio: 2.5 (calc)
Triglycerides: 67 mg/dL

## 2024-11-09 LAB — MICROALBUMIN / CREATININE URINE RATIO
Creatinine, Urine: 38 mg/dL (ref 20–275)
Microalb, Ur: <0.2 mg/dL

## 2024-11-09 LAB — TSH: TSH: 0.53 m[IU]/L

## 2024-11-09 LAB — T4, FREE: Free T4: 1.3 ng/dL (ref 0.8–1.8)

## 2024-11-11 ENCOUNTER — Ambulatory Visit: Payer: Self-pay | Admitting: Internal Medicine

## 2024-11-11 MED ORDER — DEXCOM G7 SENSOR MISC: 1.0000 | 3 refills | Status: AC

## 2024-11-14 ENCOUNTER — Encounter: Payer: Self-pay | Admitting: Internal Medicine

## 2024-11-14 DIAGNOSIS — E109 Type 1 diabetes mellitus without complications: Secondary | ICD-10-CM

## 2024-11-14 MED ORDER — OMNIPOD 5 DEXG7G6 PODS GEN 5 MISC: 12 refills | Status: AC

## 2025-03-04 ENCOUNTER — Ambulatory Visit: Admitting: Internal Medicine
